# Patient Record
Sex: Male | Born: 1940 | Race: White | Hispanic: No | Marital: Married | Smoking: Current every day smoker
Health system: Southern US, Community
[De-identification: ages and names within clinical notes are randomized; demographics above are authoritative.]

## PROBLEM LIST (undated history)

## (undated) HISTORY — PX: HERNIA REPAIR: SHX51

---

## 2008-05-13 ENCOUNTER — Inpatient Hospital Stay (HOSPITAL_COMMUNITY): Admission: EM | Admit: 2008-05-13 | Discharge: 2008-05-14 | Payer: Self-pay | Admitting: Emergency Medicine

## 2009-11-28 IMAGING — CR DG CHEST 1V PORT
1 series · 1 of 1 positions shown · non-contrast
Comparison: None

CLINICAL DATA: Inguinal hernia.  Preop respiratory exam.

PORTABLE CHEST - 1 VIEW

[view not recorded]
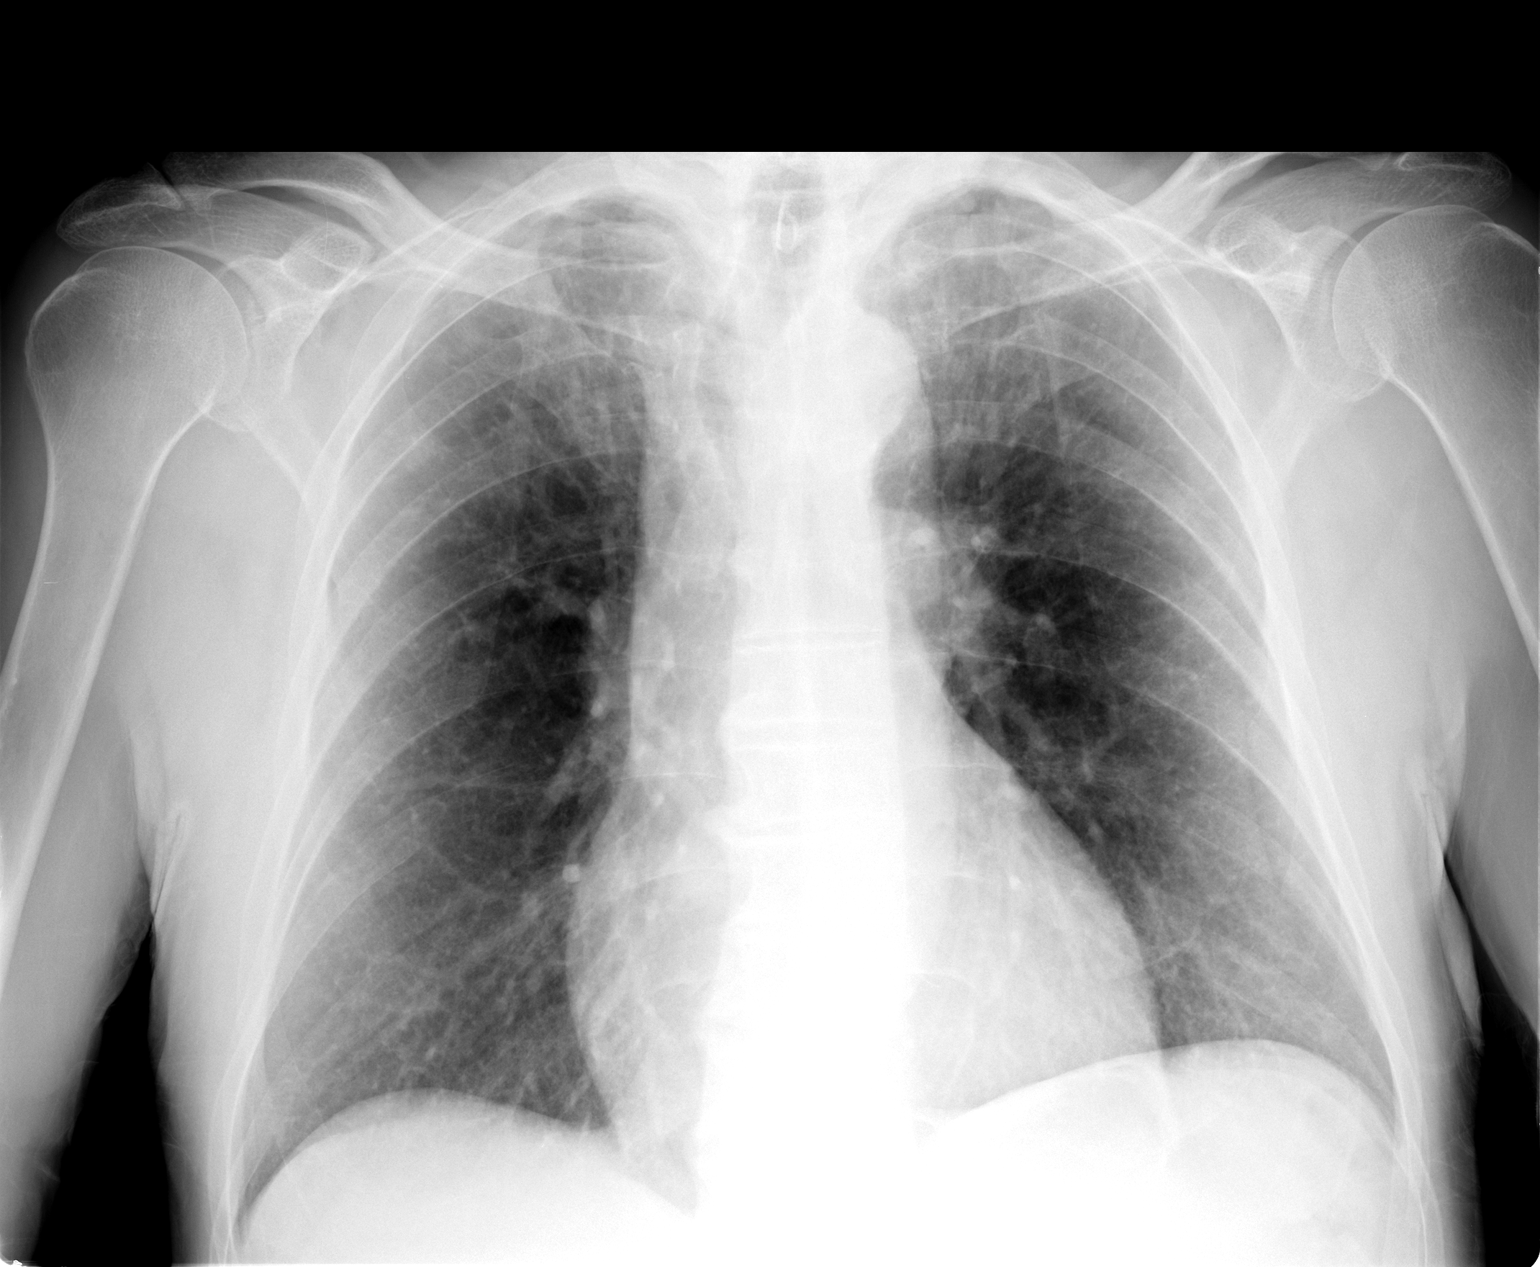

[1 of 1 positions shown; findings below may reference images not displayed]

FINDINGS: Heart size is at the upper limits of normal.  There is no
evidence of pulmonary infiltrate or edema.  There is no evidence of
pleural effusion.

A small nodular density is seen in the right upper lobe.
IMPRESSION: 1.  No acute infiltrate.
2.  Right upper lobe pulmonary nodular density.  Chest CT without
contrast is recommended for further evaluation.

## 2009-11-28 IMAGING — CT CT CHEST W/O CM
1 of 2 series · 14 of 30 positions shown, 18 images · non-contrast
Comparison: Chest radiograph earlier today.

CLINICAL DATA: Right upper lobe pulmonary nodule seen on chest
radiograph.  Evaluate for pulmonary mass.

CT CHEST WITHOUT CONTRAST
TECHNIQUE: Multidetector CT imaging of the chest was performed
following the standard protocol without IV contrast.

[Series 2: chestroutine 5.0 b40f · axial · 0.71mm/px · z∈[-402,-97]mm · 14 of 73 slices shown, 18 images]
[im 6/73  mediastinal]
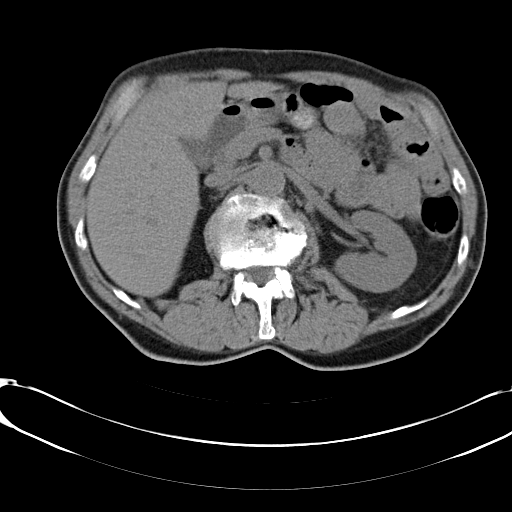
[im 6/73  lung]
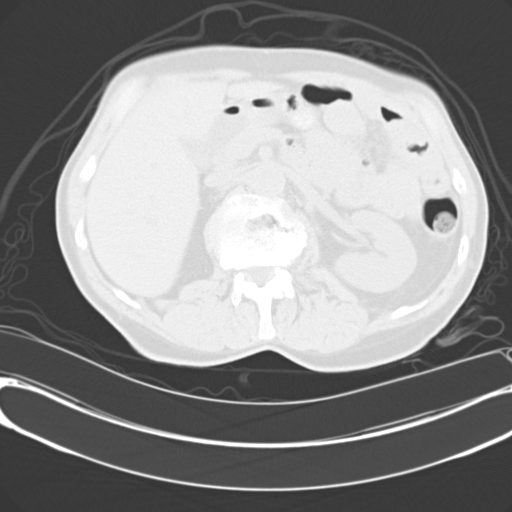
[im 11/73  lung]
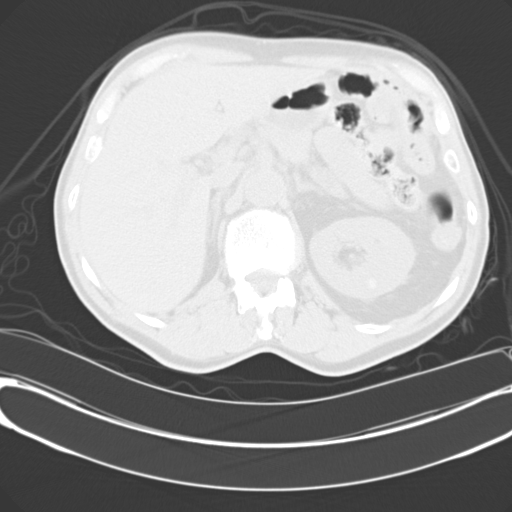
[im 16/73  lung]
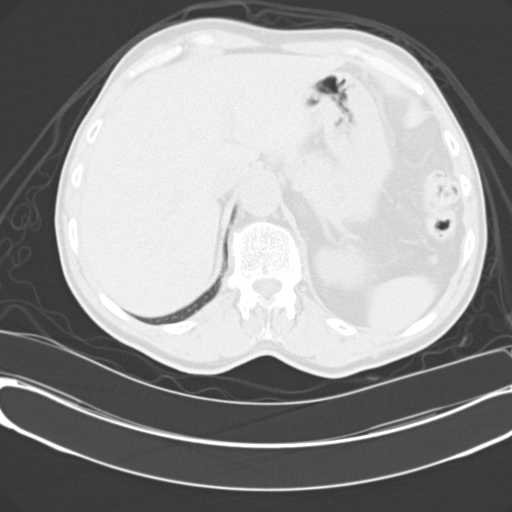
[im 21/73  lung]
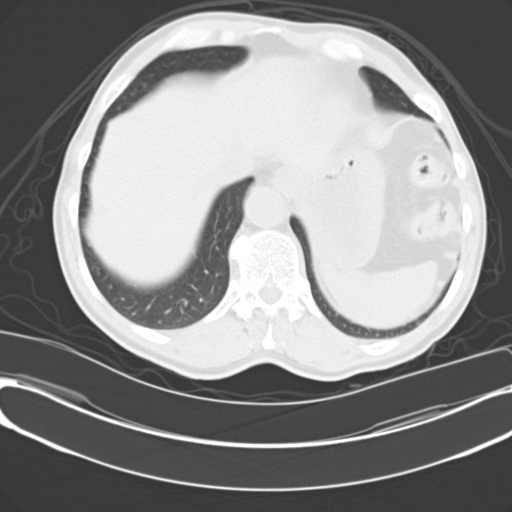
[im 26/73  mediastinal]
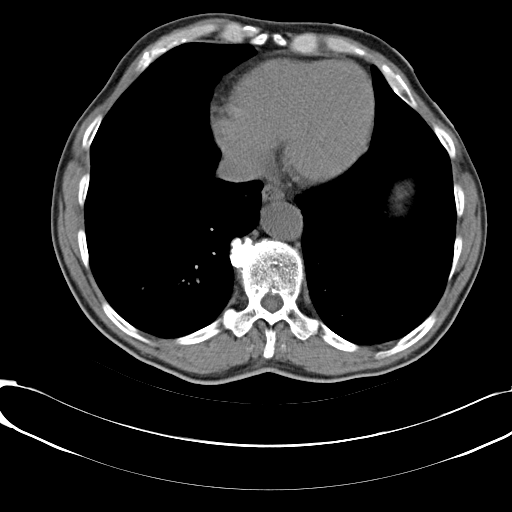
[im 26/73  lung]
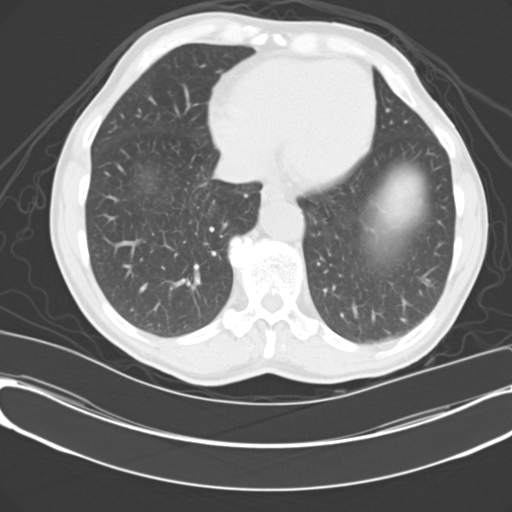
[im 31/73  lung]
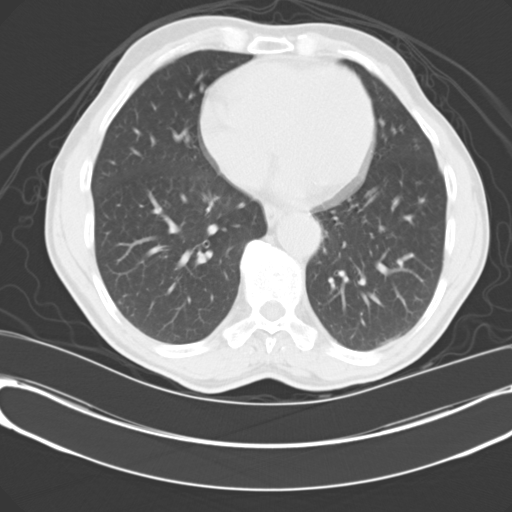
[im 35/73  lung]
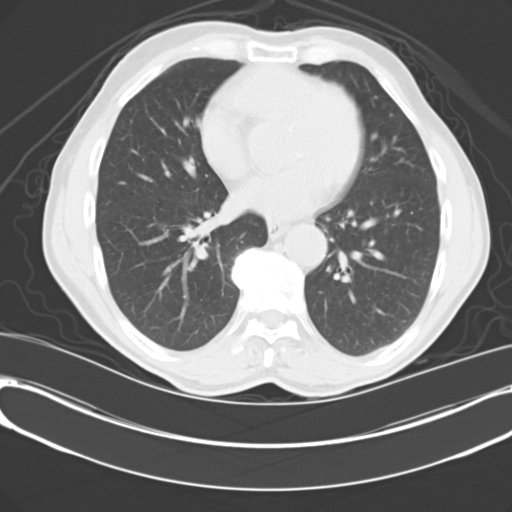
[im 37/73  lung]
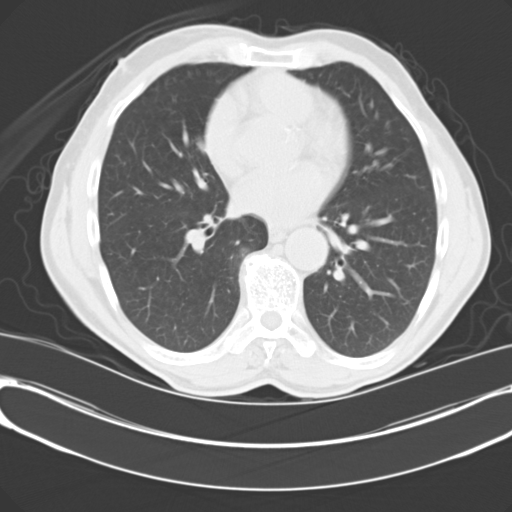
[im 42/73  mediastinal]
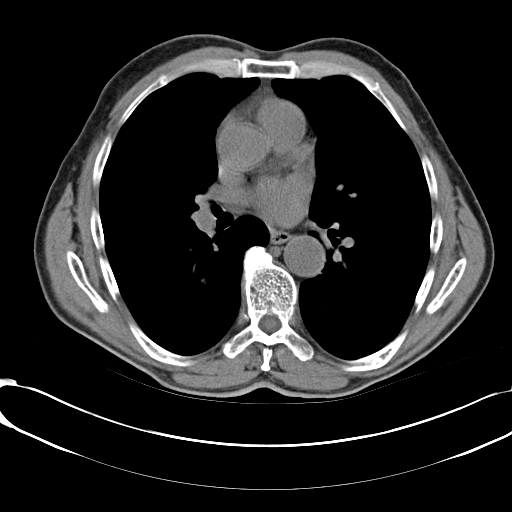
[im 42/73  lung]
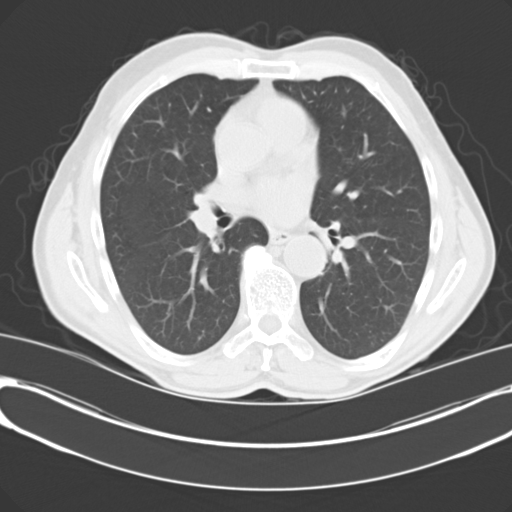
[im 47/73  lung]
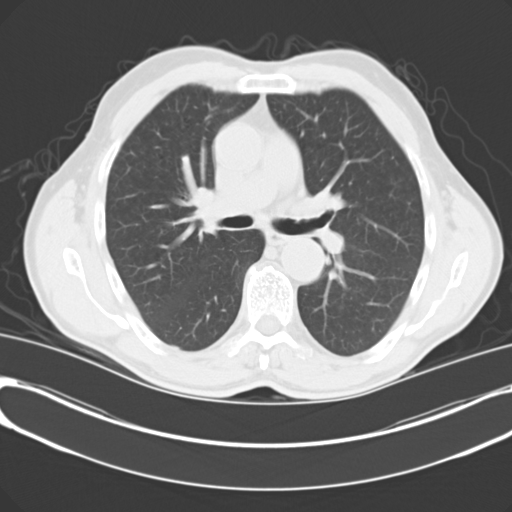
[im 52/73  lung]
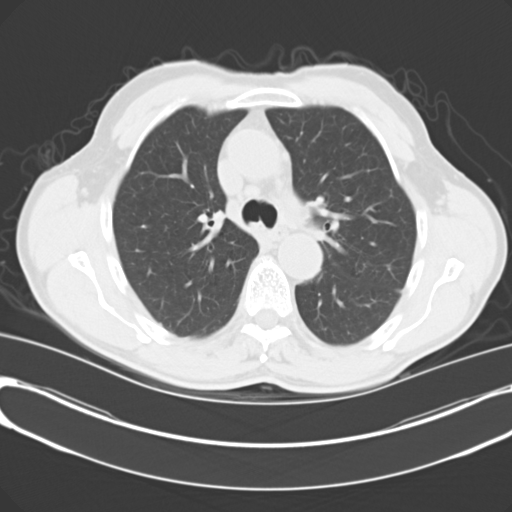
[im 57/73  lung]
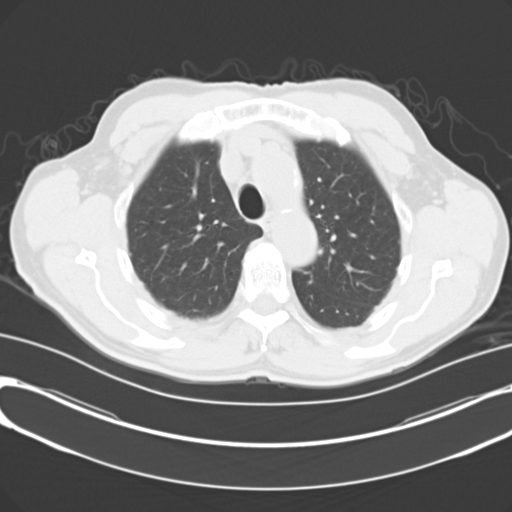
[im 62/73  mediastinal]
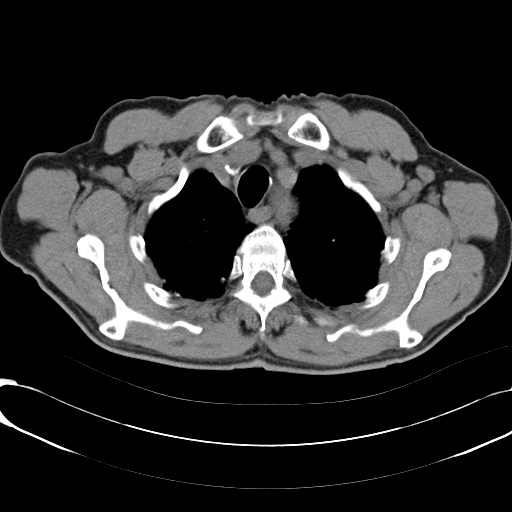
[im 62/73  lung]
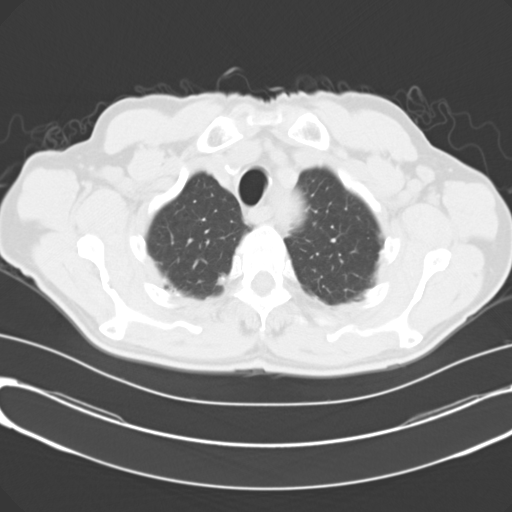
[im 67/73  lung]
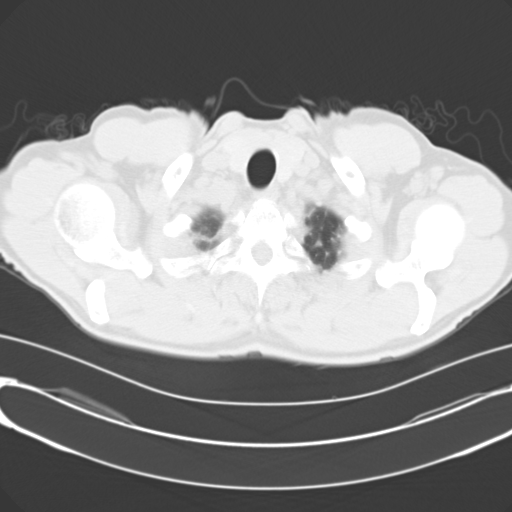

[14 of 30 positions shown; findings below may reference images not displayed]

FINDINGS: No suspicious pulmonary nodules or masses are identified.
Mild pleural - parenchymal scarring is seen in both upper lobes,
and this likely accounts for the nodular density seen on recent
chest radiograph.

There is no evidence of pulmonary infiltrate or pleural effusion.
There is no evidence of hilar or mediastinal masses, and no
adenopathy is seen elsewhere within the thorax.
IMPRESSION: No active disease.  No evidence of pulmonary nodule or mass.

## 2011-01-16 NOTE — H&P (Signed)
NAME:  Ryan [name], Ryan [name] NO.:  [id number]   MEDICAL RECORD NO.:  [id number]          PATIENT TYPE:  OBV   LOCATION:  A328                          FACILITY:  [facility]   PHYSICIAN:  Dalia Heading, M.D.  DATE OF BIRTH:  12-19-1940   DATE OF ADMISSION:  05/13/2008  DATE OF DISCHARGE:  LH                              HISTORY & PHYSICAL   CHIEF COMPLAINT:  Incarcerated right inguinal hernia.   HISTORY OF THE PRESENT ILLNESS:  The patient is a 70 year old white male  with a longstanding history of a right inguinal hernia, which was  usually reducible, who presents with a 13-hour history of the hernia not  being able to be reduced.  He has had some discomfort in this region,  but denies any nausea, vomiting or fevers.  At the bedside, I was able  to partially reduce the hernia, which relieved some of the pain he is  having.  The hernia is still present.   PAST MEDICAL HISTORY:  The past medical history is unremarkable.   PAST SURGICAL HISTORY:  The past surgical history is unremarkable.   CURRENT MEDICATIONS:  None.   ALLERGIES:  SHELLFISH.   SOCIAL HISTORY:  The patient does smoke tobacco.  He denies any  significant alcohol use.   REVIEW OF SYSTEMS:  The patient denies any recent chest pain, MI, CVA or  diabetes mellitus.   PHYSICAL EXAMINATION:  GENERAL APPEARANCE:  On physical examination the  patient is a well-developed, well-nourished white male in no acute  distress.  VITAL SIGNS:  The patient is afebrile.  Vital signs are stable.  LUNGS:  The lungs are clear to auscultation with equal breath sounds  bilaterally.  HEART:  The heart examination reveals regular rate and rhythm without  S3, S4 or murmurs.  ABDOMEN:  The abdomen is soft, nontender and nondistended.  No  hepatosplenomegaly or masses are noted.  As reported earlier a partially  reduced hernia on the right side is noted.  He also has an easily  reducible left inguinal hernia.  GENITALIA:  The  genitourinary examination is otherwise unremarkable.   LABORATORY DATA:  MET-7 is within normal limits.  White blood cell count  11.8, hematocrit 40.6 and platelet count 245,000.  Twelve-lead EKG  reveals no acute ischemic changes.  Normal sinus rhythm is noted.  Chest  x-ray reveals a questionable right lung nodule; further examination with  CT scan has been suggested.   IMPRESSION:  Incarcerated right inguinal hernia   PLAN:  The patient will be brought into the hospital to undergo a right  inguinal herniorrhaphy.  The risks and benefits of the procedure  including bleeding, infection, pain and the possibility of having to  repair the bowel, or recurrence of the hernia were fully explained to  the patient, and I gave him an informed consent.  A CT scan of the chest  will also be done to further examine the right upper lobe nodule.  Dr.  Shelva Majestic recall is aware of the patient's situation and will be  following the patient with me.  Dalia Heading, M.D.  Electronically Signed     MAJ/MEDQ  D:  05/13/2008  T:  05/13/2008  Job:  [id number]   cc:   Catalina Pizza, M.D.  Fax: [phone]

## 2011-01-16 NOTE — Discharge Summary (Signed)
NAME:  Ryan, [name] NO.:  [id number]   MEDICAL RECORD NO.:  [id number]          PATIENT TYPE:  INP   LOCATION:  A328                          FACILITY:  [facility]   PHYSICIAN:  Dalia Heading, M.D.  DATE OF BIRTH:  [birth year]   DATE OF ADMISSION:  05/13/2008  DATE OF DISCHARGE:  LH                               DISCHARGE SUMMARY   HOSPITAL COURSE SUMMARY:  The patient is a 70 year old white male  presented to the emergency room with an incarcerated right inguinal  hernia.  He was taken to the operating room and underwent a right  inguinal herniorrhaphy on May 13, 2008.  He tolerated the  procedure well.  His postoperative course was unremarkable.  His diet  was advanced without difficulty.  The patient is being discharged home  on postoperative day #1 in good improving condition.   DISCHARGE INSTRUCTIONS:  The patient is to follow up Dr. Franky Macho on  May 20, 2008.   DISCHARGE MEDICATIONS:  Vicodin 1-2 tablets p.o. q.4 h. p.r.n. pain.   PRINCIPAL DIAGNOSIS:  Incarcerated right inguinal hernia.   PRINCIPAL PROCEDURE:  Right inguinal herniorrhaphy on May 13, 2008.      Dalia Heading, M.D.  Electronically Signed     MAJ/MEDQ  D:  05/14/2008  T:  05/14/2008  Job:  [id number]   cc:   Catalina Pizza, M.D.  Fax: [phone]

## 2011-01-16 NOTE — Op Note (Signed)
NAME:  Ryan [name], Ryan [name] NO.:  [id number]   MEDICAL RECORD NO.:  [id number]          PATIENT TYPE:  INP   LOCATION:  A328                          FACILITY:  [facility]   PHYSICIAN:  Dalia Heading, M.D.  DATE OF BIRTH:  [birth year]   DATE OF PROCEDURE:  05/13/2008  DATE OF DISCHARGE:                               OPERATIVE REPORT   PREOPERATIVE DIAGNOSIS:  Incarcerated right inguinal hernia.   POSTOPERATIVE DIAGNOSIS:  Incarcerated right inguinal hernia.   PROCEDURE:  Right inguinal herniorrhaphy for incarceration.   SURGEON:  Dalia Heading, MD   ANESTHESIA:  Spinal.   INDICATIONS:  The patient is a 70 year old white male who presented to  the emergency room earlier this morning with an incarcerated right  inguinal hernia.  It was partially reduced.  The patient now comes to  the operating room for right inguinal herniorrhaphy.  The risks and  benefits of the procedure including bleeding, infection, pain, and  recurrence of the hernia were fully explained to the patient, gave  informed consent.   PROCEDURE NOTE:  The patient was placed in the supine position after  spinal anesthesia was administered.  The right groin region and scrotum  were prepped and draped using the usual sterile technique with  Hibiclens.  Surgical site confirmation was performed.   A transverse incision was made in the right groin region down to the  external oblique aponeuroses.  The aponeurosis was incised, even though  it was significantly thinned.  The patient was noted to have a large  indirect hernia sac extending to the testicle.  The contents were  reduced manually without difficulty.  The hernia sac was then freed away  from the spermatic cord and right testicle.  It was inverted at its  peritoneal reflection.  A large-size polypropylene mesh plug was then  placed into this region without difficulty.  An onlay polypropylene mesh  patch was then placed along the floor of the  inguinal canal and secured  superiorly to the conjoined tendon and inferiorly to the shelving edge  of Poupart ligament using 2-0 Novafil interrupted sutures.  The internal  ring was recreated using a 2-0 Novafil interrupted suture.  The external  oblique aponeurosis was reapproximated using a 2-0 Vicryl running  suture.  The subcutaneous layer was reapproximated using a 3-0 Vicryl  interrupted suture.  The skin was closed using staples.  Sensorcaine  0.5% was instilled in the surrounding wound.  Bacitracin ointment was  then applied.   All tape and needle counts were correct at end of the procedure.  The  patient was transferred to PACU in stable condition.   COMPLICATIONS:  None.   SPECIMEN:  None.   ESTIMATED BLOOD LOSS:  Minimal.      Dalia Heading, M.D.  Electronically Signed     MAJ/MEDQ  D:  05/13/2008  T:  05/14/2008  Job:  [id number]   cc:   Catalina Pizza, M.D.  Fax: [phone]

## 2011-06-06 LAB — BASIC METABOLIC PANEL
BUN: 12
CO2: 28
Calcium: 10.2
Chloride: 103
Creatinine, Ser: 0.87
GFR calc Af Amer: >60
GFR calc non Af Amer: >60
Glucose, Bld: 98
Potassium: 3.8
Sodium: 137

## 2011-06-06 LAB — CBC
HCT: 40.6
Hemoglobin: 14
MCHC: 34.5
MCV: 93.2
Platelets: 245
RBC: 4.35
RDW: 13.9
WBC: 11.8 — ABNORMAL HIGH

## 2012-06-18 DIAGNOSIS — Z23 Encounter for immunization: Secondary | ICD-10-CM | POA: Diagnosis not present

## 2013-04-17 ENCOUNTER — Ambulatory Visit: Payer: Self-pay | Admitting: Cardiology

## 2013-04-17 ENCOUNTER — Encounter: Payer: Self-pay | Admitting: Cardiology

## 2013-05-25 ENCOUNTER — Telehealth: Payer: Self-pay | Admitting: *Deleted

## 2013-05-26 NOTE — Telephone Encounter (Signed)
error 

## 2013-06-23 DIAGNOSIS — Z23 Encounter for immunization: Secondary | ICD-10-CM | POA: Diagnosis not present

## 2014-06-08 DIAGNOSIS — Z0000 Encounter for general adult medical examination without abnormal findings: Secondary | ICD-10-CM | POA: Diagnosis not present

## 2014-06-08 DIAGNOSIS — Z23 Encounter for immunization: Secondary | ICD-10-CM | POA: Diagnosis not present

## 2015-04-06 DIAGNOSIS — H3532 Exudative age-related macular degeneration: Secondary | ICD-10-CM | POA: Diagnosis not present

## 2015-04-22 ENCOUNTER — Encounter (INDEPENDENT_AMBULATORY_CARE_PROVIDER_SITE_OTHER): Payer: Medicare Other | Admitting: Ophthalmology

## 2015-04-22 DIAGNOSIS — H3531 Nonexudative age-related macular degeneration: Secondary | ICD-10-CM

## 2015-04-22 DIAGNOSIS — H43813 Vitreous degeneration, bilateral: Secondary | ICD-10-CM

## 2015-05-10 DIAGNOSIS — H3532 Exudative age-related macular degeneration: Secondary | ICD-10-CM | POA: Diagnosis not present

## 2015-06-16 DIAGNOSIS — Z23 Encounter for immunization: Secondary | ICD-10-CM | POA: Diagnosis not present

## 2015-08-22 ENCOUNTER — Ambulatory Visit (INDEPENDENT_AMBULATORY_CARE_PROVIDER_SITE_OTHER): Payer: Medicare Other | Admitting: Ophthalmology

## 2015-08-22 DIAGNOSIS — H2513 Age-related nuclear cataract, bilateral: Secondary | ICD-10-CM | POA: Diagnosis not present

## 2015-08-22 DIAGNOSIS — H353112 Nonexudative age-related macular degeneration, right eye, intermediate dry stage: Secondary | ICD-10-CM

## 2015-08-22 DIAGNOSIS — H43813 Vitreous degeneration, bilateral: Secondary | ICD-10-CM

## 2015-08-22 DIAGNOSIS — H353221 Exudative age-related macular degeneration, left eye, with active choroidal neovascularization: Secondary | ICD-10-CM

## 2016-02-20 ENCOUNTER — Ambulatory Visit (INDEPENDENT_AMBULATORY_CARE_PROVIDER_SITE_OTHER): Payer: Medicare Other | Admitting: Ophthalmology

## 2016-02-20 DIAGNOSIS — H353221 Exudative age-related macular degeneration, left eye, with active choroidal neovascularization: Secondary | ICD-10-CM

## 2016-02-20 DIAGNOSIS — H2513 Age-related nuclear cataract, bilateral: Secondary | ICD-10-CM

## 2016-02-20 DIAGNOSIS — H353112 Nonexudative age-related macular degeneration, right eye, intermediate dry stage: Secondary | ICD-10-CM

## 2016-02-20 DIAGNOSIS — H43813 Vitreous degeneration, bilateral: Secondary | ICD-10-CM | POA: Diagnosis not present

## 2016-05-01 ENCOUNTER — Other Ambulatory Visit: Payer: Self-pay

## 2016-06-12 DIAGNOSIS — E119 Type 2 diabetes mellitus without complications: Secondary | ICD-10-CM | POA: Diagnosis not present

## 2016-06-12 DIAGNOSIS — N529 Male erectile dysfunction, unspecified: Secondary | ICD-10-CM | POA: Diagnosis not present

## 2016-06-12 DIAGNOSIS — I10 Essential (primary) hypertension: Secondary | ICD-10-CM | POA: Diagnosis not present

## 2016-06-12 DIAGNOSIS — E785 Hyperlipidemia, unspecified: Secondary | ICD-10-CM | POA: Diagnosis not present

## 2016-06-12 DIAGNOSIS — I482 Chronic atrial fibrillation: Secondary | ICD-10-CM | POA: Diagnosis not present

## 2016-06-12 DIAGNOSIS — R7301 Impaired fasting glucose: Secondary | ICD-10-CM | POA: Diagnosis not present

## 2016-06-12 DIAGNOSIS — E782 Mixed hyperlipidemia: Secondary | ICD-10-CM | POA: Diagnosis not present

## 2016-06-12 DIAGNOSIS — E039 Hypothyroidism, unspecified: Secondary | ICD-10-CM | POA: Diagnosis not present

## 2016-06-14 DIAGNOSIS — Z720 Tobacco use: Secondary | ICD-10-CM | POA: Diagnosis not present

## 2016-06-14 DIAGNOSIS — K469 Unspecified abdominal hernia without obstruction or gangrene: Secondary | ICD-10-CM | POA: Diagnosis not present

## 2016-06-14 DIAGNOSIS — Z6822 Body mass index (BMI) 22.0-22.9, adult: Secondary | ICD-10-CM | POA: Diagnosis not present

## 2016-06-14 DIAGNOSIS — R030 Elevated blood-pressure reading, without diagnosis of hypertension: Secondary | ICD-10-CM | POA: Diagnosis not present

## 2016-06-14 DIAGNOSIS — H3530 Unspecified macular degeneration: Secondary | ICD-10-CM | POA: Diagnosis not present

## 2016-06-14 DIAGNOSIS — Z0000 Encounter for general adult medical examination without abnormal findings: Secondary | ICD-10-CM | POA: Diagnosis not present

## 2016-06-14 DIAGNOSIS — Z23 Encounter for immunization: Secondary | ICD-10-CM | POA: Diagnosis not present

## 2016-06-28 DIAGNOSIS — K4090 Unilateral inguinal hernia, without obstruction or gangrene, not specified as recurrent: Secondary | ICD-10-CM | POA: Diagnosis not present

## 2016-07-02 NOTE — H&P (Signed)
  NTS SOAP Note  Vital Signs:  Vitals as of: 06/28/2016: Systolic 159: Diastolic 88: Heart Rate 64: Temp 99.29F (Temporal): Height 336ft 0in: Weight 159Lbs 0 Ounces: BMI 21.56   BMI : 21.56 kg/m2  Subjective: This 75 year old male presents for of a left inguinal hernia.  Has been present for some time. May worsen with strainiing, stays down in scrotum.  No nausea, vomiting, constipation, diarrhea.  No significant pain noted.  Is uncomfortable.  Review of Symptoms:  Constitutional:negative Head:negative Eyes:negative Nose/Mouth/Throat:negative Cardiovascular:negative Respiratory:negative Gastrointestinnegative Genitourinary:negative Musculoskeletal:negative Skin:negative Hematolgic/Lymphatic:negative Allergic/Immunologic:negative   Past Medical History:Reviewed  Past Medical History  Surgical History: RIH Medical Problems: macular degeneration Allergies: shellfish Medications: areds, ester c   Social History:Reviewed  Social History  Preferred Language: English Race:  White Ethnicity: Not Hispanic / Latino Age: 75 year Marital Status:  M Alcohol: no   Smoking Status: Current every day smoker reviewed on 06/28/2016 Started Date:  Packs per week:  Functional Status reviewed on 06/28/2016 ------------------------------------------------ Bathing: Normal Cooking: Normal Dressing: Normal Driving: Normal Eating: Normal Managing Meds: Normal Oral Care: Normal Shopping: Normal Toileting: Normal Transferring: Normal Walking: Normal Cognitive Status reviewed on 06/28/2016 ------------------------------------------------ Attention: Normal Decision Making: Normal Language: Normal Memory: Normal Motor: Normal Perception: Normal Problem Solving: Normal Visual and Spatial: Normal   Family History:Reviewed  Family Health History Mother, Deceased; Healthy;  Father, Deceased; Healthy;     Objective Information: General:Well  appearing, well nourished in no distress. Skin:no rash or prominent lesions Neck:Supple without lymphadenopathy.  Heart:RRR, no murmur or gallop.  Normal S1, S2.  No S3, S4.  Lungs:CTA bilaterally, no wheezes, rhonchi, rales.  Breathing unlabored. Abdomen:Soft, NT/ND, normal bowel sounds, no HSM, no masses.  No peritoneal signs.  Large left inguinal hernia extending down into the scrotum. as above Dr. Scharlene GlossHall's notes reviewed. Assessment:Left inguinal hernia  Diagnoses: 550.90  K40.90 Unilateral or unspecified inguinal hernia, without mention of obstruction or gangrene (not specified as recurrent)  Procedures: [id number] - OFFICE OUTPATIENT NEW 30 MINUTES    Plan:  Scheduled for left inguinal herniorrhaphy with mesh on 07/16/16.   Patient Education:Alternative treatments to surgery were discussed with patient (and family).Risks and benefits  of procedure including bleeding, infection, mesh use, and the possibility of recurrence of the hernia were fully explained to the patient (and family) who gave informed consent. Patient/family questions were addressed.  Follow-up:Pending Surgery

## 2016-07-10 NOTE — Patient Instructions (Signed)
Ryan [name]  07/10/2016     @PREFPERIOPPHARMACY @   Your procedure is scheduled on  07/16/2016   Report to [facility] at  615  A.M.  Call this number if you have problems the morning of surgery:  [phone]   Remember:  Do not eat food or drink liquids after midnight.  Take these medicines the morning of surgery with A SIP OF WATER  none   Do not wear jewelry, make-up or nail polish.  Do not wear lotions, powders, or perfumes, or deoderant.  Do not shave 48 hours prior to surgery.  Men may shave face and neck.  Do not bring valuables to the hospital.  [facility] is not responsible for any belongings or valuables.  Contacts, dentures or bridgework may not be worn into surgery.  Leave your suitcase in the car.  After surgery it may be brought to your room.  For patients admitted to the hospital, discharge time will be determined by your treatment team.  Patients discharged the day of surgery will not be allowed to drive home.   Name and phone number of your driver:   family Special instructions:  none  Please read over the following fact sheets that you were given. Anesthesia Post-op Instructions and Care and Recovery After Surgery      Open Hernia Repair Open hernia repair is surgery to fix a hernia. A hernia occurs when an internal organ or tissue pushes out through a weak spot in the abdominal wall muscles. Hernias commonly occur in the groin and around the navel. Most hernias tend to get worse over time. Surgery is often done to prevent the hernia from getting bigger, becoming uncomfortable, or becoming an emergency. Emergency surgery may be needed if abdominal contents get stuck in the opening (incarcerated hernia) or the blood supply gets cut off (strangulated hernia). In an open repair, a large cut (incision) is made in the abdomen to perform the surgery. LET [facility] CARE PROVIDER KNOW ABOUT:  Any allergies you have.  All medicines  you are taking, including vitamins, herbs, eye drops, creams, and over-the-counter medicines.  Previous problems you or members of your family have had with the use of anesthetics.  Any blood disorders you have.  Previous surgeries you have had.  Medical conditions you have. RISKS AND COMPLICATIONS Generally, this is a safe procedure. However, as with any procedure, complications can occur. Possible complications include:  Infection.  Bleeding.  Nerve injury.  Chronic pain.  The hernia can come back.  Injury to the intestines. BEFORE THE PROCEDURE  Ask your health care provider about changing or stopping any regular medicines. Avoid taking aspirin or blood thinners as directed by your health care provider.  Do noteat or drink anything after midnight the night before surgery.  If you smoke, do not smoke for at least 2 weeks before your surgery.  Do not drink alcohol the day before your surgery.  Let your health care provider know if you develop a cold or any infection before your surgery.  Arrange for someone to drive you home after the procedure or after your hospital stay. Also arrange for someone to help you with activities during recovery. PROCEDURE   Small monitors will be put on your body. They are used to check your heart, blood pressure, and oxygen level.   An IV access tube will be put into one of your veins. Medicine  will be able to flow directly into your body through this IV tube.   You might be given a medicine to help you relax (sedative).   You will be given a medicine to make you sleep (general anesthetic). A breathing tube may be placed into your lungs during the procedure.  A cut (incision) is made over the hernia defect, and the contents are pushed back into the abdomen.  If the hernia is small, stitches may be used to bring the muscle edges back together.  Typically, a surgeon will place a mesh patch made of man-made material (synthetic) to  cover the defect. The mesh is sewn to healthy muscle. This reduces the risk of the hernia coming back.  The tissue and skin over the hernia are then closed with stitches or staples.  If the hernia was large, a drain may be left in place to collect excess fluid where the hernia used to be.  Bandages (dressings) are used to cover the incision. AFTER THE PROCEDURE  You will be taken to a recovery area where your progress will be monitored.  If the hernia was small or in the groin (inguinal) region, you will likely be allowed to go home once you are awake, stable, and taking fluids well.  If the hernia was large, you may have to wait for your bowel function to return. You may need to stay in the hospital for 2-3 days until you can eat and your pain is controlled. A drain may be left in place for 5-7 days. You will be taught how to care for the drain.   This information is not intended to replace advice given to you by your health care provider. Make sure you discuss any questions you have with your health care provider.   Document Released: 02/13/2001 Document Revised: 06/10/2013 Document Reviewed: 04/01/2013 Elsevier Interactive Patient Education 2016 Elsevier Inc[employer]  Open Hernia Repair, Care After These instructions give you information about caring for yourself after your procedure. Your doctor may also give you more specific instructions. Call your doctor if you have any problems or questions after your procedure. HOME CARE  Keep the cut (incision) area clean and dry. You may gently wash the incision area with soap and water 48 hours after surgery. To dry the incision area, gently blot or dab it.  Do not take baths, swim, or use a hot tub for 10 days or until your doctor approves.  Change bandages (dressings) as told by your doctor.  Check your incision area every day for signs of infection. Watch for:  Redness, swelling, or pain.  Fluid , blood, or pus.  Eat plenty of fruits and  vegetables. This helps to prevent constipation.  Drink enough fluid to keep your pee (urine) clear or pale yellow. This also helps to prevent constipation.  Do not drive or operate heavy machinery until your doctor says it is okay.  Do not lift anything that is heavier than 10 lb (4.5 kg) until your doctor approves.  Do not play contact sports for 4 weeks or until your doctor approves.  Take medicines only as told by your doctor.  Keep all follow-up visits as told by your doctor. This is important. Ask your doctor when to make an appointment to have your stitches (sutures) or staples removed. GET HELP IF:  The incision is bleeding more than before.  You have blood in your poop (stool).  The incision hurts more than before.  You have redness, swelling, or pain  in your incision area.  You have fluid, blood, or pus coming from your incision.  You have a fever.  You notice a bad smell coming from the incision area or the dressing. GET HELP RIGHT AWAY IF:  You have a rash.  Your chest hurts.  You are short of breath.  You feel light-headed.  You feel weak and dizzy (feel faint).   This information is not intended to replace advice given to you by your health care provider. Make sure you discuss any questions you have with your health care provider.   Document Released: 09/10/2014 Document Reviewed: 09/10/2014 Elsevier Interactive Patient Education 2016 Elsevier Inc. General Anesthesia, Adult General anesthesia is a sleep-like state of non-feeling produced by medicines (anesthetics). General anesthesia prevents you from being alert and feeling pain during a medical procedure. Your caregiver may recommend general anesthesia if your procedure:  Is long.  Is painful or uncomfortable.  Would be frightening to see or hear.  Requires you to be still.  Affects your breathing.  Causes significant blood loss. LET YOUR CAREGIVER KNOW ABOUT:  Allergies to food or  medicine.  Medicines taken, including vitamins, herbs, eyedrops, over-the-counter medicines, and creams.  Use of steroids (by mouth or creams).  Previous problems with anesthetics or numbing medicines, including problems experienced by relatives.  History of bleeding problems or blood clots.  Previous surgeries and types of anesthetics received.  Possibility of pregnancy, if this applies.  Use of cigarettes, alcohol, or illegal drugs.  Any health condition(s), especially diabetes, sleep apnea, and high blood pressure. RISKS AND COMPLICATIONS General anesthesia rarely causes complications. However, if complications do occur, they can be life threatening. Complications include:  A lung infection.  A stroke.  A heart attack.  Waking up during the procedure. When this occurs, the patient may be unable to move and communicate that he or she is awake. The patient may feel severe pain. Older adults and adults with serious medical problems are more likely to have complications than adults who are young and healthy. Some complications can be prevented by answering all of your caregiver's questions thoroughly and by following all pre-procedure instructions. It is important to tell your caregiver if any of the pre-procedure instructions, especially those related to diet, were not followed. Any food or liquid in the stomach can cause problems when you are under general anesthesia. BEFORE THE PROCEDURE  Ask your caregiver if you will have to spend the night at the hospital. If you will not have to spend the night, arrange to have an adult drive you and stay with you for 24 hours.  Follow your caregiver's instructions if you are taking dietary supplements or medicines. Your caregiver may tell you to stop taking them or to reduce your dosage.  Do not smoke for as long as possible before your procedure. If possible, stop smoking 3-6 weeks before the procedure.  Do not take new dietary supplements  or medicines within 1 week of your procedure unless your caregiver approves them.  Do not eat within 8 hours of your procedure or as directed by your caregiver. Drink only clear liquids, such as water, black coffee (without milk or cream), and fruit juices (without pulp).  Do not drink within 3 hours of your procedure or as directed by your caregiver.  You may brush your teeth on the morning of the procedure, but make sure to spit out the toothpaste and water when finished. PROCEDURE  You will receive anesthetics through a mask, through  an intravenous (IV) access tube, or through both. A doctor who specializes in anesthesia (anesthesiologist) or a nurse who specializes in anesthesia (nurse anesthetist) or both will stay with you throughout the procedure to make sure you remain unconscious. He or she will also watch your blood pressure, pulse, and oxygen levels to make sure that the anesthetics do not cause any problems. Once you are asleep, a breathing tube or mask may be used to help you breathe. AFTER THE PROCEDURE You will wake up after the procedure is complete. You may be in the room where the procedure was performed or in a recovery area. You may have a sore throat if a breathing tube was used. You may also feel:  Dizzy.  Weak.  Drowsy.  Confused.  Nauseous.  Cold. These are all normal responses and can be expected to last for up to 24 hours after the procedure is complete. A caregiver will tell you when you are ready to go home. This will usually be when you are fully awake and in stable condition.   This information is not intended to replace advice given to you by your health care provider. Make sure you discuss any questions you have with your health care provider.   Document Released: 11/27/2007 Document Revised: 09/10/2014 Document Reviewed: 12/19/2011 Elsevier Interactive Patient Education 2016 Elsevier Inc. General Anesthesia, Adult, Care After Refer to this sheet in the  next few weeks. These instructions provide you with information on caring for yourself after your procedure. Your health care provider may also give you more specific instructions. Your treatment has been planned according to current medical practices, but problems sometimes occur. Call your health care provider if you have any problems or questions after your procedure. WHAT TO EXPECT AFTER THE PROCEDURE After the procedure, it is typical to experience:  Sleepiness.  Nausea and vomiting. HOME CARE INSTRUCTIONS  For the first 24 hours after general anesthesia:  Have a responsible person with you.  Do not drive a car. If you are alone, do not take public transportation.  Do not drink alcohol.  Do not take medicine that has not been prescribed by your health care provider.  Do not sign important papers or make important decisions.  You may resume a normal diet and activities as directed by your health care provider.  Change bandages (dressings) as directed.  If you have questions or problems that seem related to general anesthesia, call the hospital and ask for the anesthetist or anesthesiologist on call. SEEK MEDICAL CARE IF:  You have nausea and vomiting that continue the day after anesthesia.  You develop a rash. SEEK IMMEDIATE MEDICAL CARE IF:   You have difficulty breathing.  You have chest pain.  You have any allergic problems.   This information is not intended to replace advice given to you by your health care provider. Make sure you discuss any questions you have with your health care provider.   Document Released: 11/26/2000 Document Revised: 09/10/2014 Document Reviewed: 12/19/2011 Elsevier Interactive Patient Education 2016 Elsevier Inc. PATIENT INSTRUCTIONS POST-ANESTHESIA  IMMEDIATELY FOLLOWING SURGERY:  Do not drive or operate machinery for the first twenty four hours after surgery.  Do not make any important decisions for twenty four hours after surgery or  while taking narcotic pain medications or sedatives.  If you develop intractable nausea and vomiting or a severe headache please notify your doctor immediately.  FOLLOW-UP:  Please make an appointment with your surgeon as instructed. You do not need to follow  up with anesthesia unless specifically instructed to do so.  WOUND CARE INSTRUCTIONS (if applicable):  Keep a dry clean dressing on the anesthesia/puncture wound site if there is drainage.  Once the wound has quit draining you may leave it open to air.  Generally you should leave the bandage intact for twenty four hours unless there is drainage.  If the epidural site drains for more than 36-48 hours please call the anesthesia department.  QUESTIONS?:  Please feel free to call your physician or the hospital operator if you have any questions, and they will be happy to assist you.

## 2016-07-12 ENCOUNTER — Encounter (HOSPITAL_COMMUNITY)
Admission: RE | Admit: 2016-07-12 | Discharge: 2016-07-12 | Disposition: A | Discharge status: Home / Self Care | Payer: Medicare Other | Source: Ambulatory Visit | Attending: General Surgery | Admitting: General Surgery

## 2016-07-12 ENCOUNTER — Other Ambulatory Visit: Payer: Self-pay

## 2016-07-12 DIAGNOSIS — R9431 Abnormal electrocardiogram [ECG] [EKG]: Secondary | ICD-10-CM | POA: Insufficient documentation

## 2016-07-12 DIAGNOSIS — Z01812 Encounter for preprocedural laboratory examination: Secondary | ICD-10-CM | POA: Diagnosis not present

## 2016-07-12 DIAGNOSIS — Z01818 Encounter for other preprocedural examination: Secondary | ICD-10-CM | POA: Diagnosis not present

## 2016-07-12 DIAGNOSIS — K4090 Unilateral inguinal hernia, without obstruction or gangrene, not specified as recurrent: Secondary | ICD-10-CM | POA: Insufficient documentation

## 2016-07-12 LAB — CBC WITH DIFFERENTIAL/PLATELET
BASOS ABS: 0 10*3/uL (ref 0.0–0.1)
BASOS PCT: 0 %
EOS ABS: 0.1 10*3/uL (ref 0.0–0.7)
Eosinophils Relative: 2 %
HCT: 39.2 % (ref 39.0–52.0)
HEMOGLOBIN: 12.9 g/dL — AB (ref 13.0–17.0)
Lymphocytes Relative: 23 %
Lymphs Abs: 1.4 10*3/uL (ref 0.7–4.0)
MCH: 30.9 pg (ref 26.0–34.0)
MCHC: 32.9 g/dL (ref 30.0–36.0)
MCV: 93.8 fL (ref 78.0–100.0)
Monocytes Absolute: 0.5 10*3/uL (ref 0.1–1.0)
Monocytes Relative: 7 %
NEUTROS PCT: 68 %
Neutro Abs: 4.2 10*3/uL (ref 1.7–7.7)
Platelets: 232 10*3/uL (ref 150–400)
RBC: 4.18 MIL/uL — AB (ref 4.22–5.81)
RDW: 14.5 % (ref 11.5–15.5)
WBC: 6.2 10*3/uL (ref 4.0–10.5)

## 2016-07-12 LAB — BASIC METABOLIC PANEL
ANION GAP: 4 — AB (ref 5–15)
BUN: 13 mg/dL (ref 6–20)
CALCIUM: 9 mg/dL (ref 8.9–10.3)
CHLORIDE: 105 mmol/L (ref 101–111)
CO2: 29 mmol/L (ref 22–32)
CREATININE: 0.84 mg/dL (ref 0.61–1.24)
GFR calc non Af Amer: >60 mL/min (ref >60)
Glucose, Bld: 89 mg/dL (ref 65–99)
Potassium: 3.9 mmol/L (ref 3.5–5.1)
SODIUM: 138 mmol/L (ref 135–145)

## 2016-07-16 ENCOUNTER — Encounter (HOSPITAL_COMMUNITY)
Admission: RE | Disposition: A | Discharge status: Home / Self Care | Payer: Self-pay | Source: Ambulatory Visit | Attending: General Surgery

## 2016-07-16 ENCOUNTER — Ambulatory Visit (HOSPITAL_COMMUNITY): Payer: Medicare Other | Admitting: Anesthesiology

## 2016-07-16 ENCOUNTER — Encounter (HOSPITAL_COMMUNITY): Payer: Self-pay | Admitting: *Deleted

## 2016-07-16 ENCOUNTER — Ambulatory Visit (HOSPITAL_COMMUNITY)
Admission: RE | Admit: 2016-07-16 | Discharge: 2016-07-16 | Disposition: A | Discharge status: Home / Self Care | Payer: Medicare Other | Source: Ambulatory Visit | Attending: General Surgery | Admitting: General Surgery

## 2016-07-16 DIAGNOSIS — K4090 Unilateral inguinal hernia, without obstruction or gangrene, not specified as recurrent: Secondary | ICD-10-CM | POA: Insufficient documentation

## 2016-07-16 DIAGNOSIS — F17200 Nicotine dependence, unspecified, uncomplicated: Secondary | ICD-10-CM | POA: Insufficient documentation

## 2016-07-16 HISTORY — PX: INGUINAL HERNIA REPAIR: SHX194

## 2016-07-16 SURGERY — REPAIR, HERNIA, INGUINAL, ADULT
Anesthesia: General | Laterality: Left

## 2016-07-16 MED ORDER — ONDANSETRON HCL 4 MG/2ML IJ SOLN
INTRAMUSCULAR | Status: AC
  Filled 2016-07-16: qty 2

## 2016-07-16 MED ORDER — KETOROLAC TROMETHAMINE 30 MG/ML IJ SOLN
30.0000 mg | Freq: Once | INTRAMUSCULAR | Status: AC
  Administered 2016-07-16: 30 mg via INTRAVENOUS

## 2016-07-16 MED ORDER — EPHEDRINE SULFATE 50 MG/ML IJ SOLN
INTRAMUSCULAR | Status: DC | PRN
  Administered 2016-07-16 (×3): 10 mg via INTRAVENOUS

## 2016-07-16 MED ORDER — FENTANYL CITRATE (PF) 100 MCG/2ML IJ SOLN
INTRAMUSCULAR | Status: DC | PRN
  Administered 2016-07-16 (×3): 25 ug via INTRAVENOUS

## 2016-07-16 MED ORDER — FENTANYL CITRATE (PF) 100 MCG/2ML IJ SOLN
25.0000 ug | INTRAMUSCULAR | Status: AC | PRN
  Administered 2016-07-16 (×2): 25 ug via INTRAVENOUS

## 2016-07-16 MED ORDER — PROPOFOL 10 MG/ML IV BOLUS
INTRAVENOUS | Status: DC | PRN
Start: 2016-07-16 — End: 2016-07-16
  Administered 2016-07-16: 150 mg via INTRAVENOUS

## 2016-07-16 MED ORDER — LIDOCAINE HCL (PF) 1 % IJ SOLN
INTRAMUSCULAR | Status: AC
  Filled 2016-07-16: qty 5

## 2016-07-16 MED ORDER — CHLORHEXIDINE GLUCONATE CLOTH 2 % EX PADS: 6.0000 | MEDICATED_PAD | Freq: Once | CUTANEOUS | Status: DC

## 2016-07-16 MED ORDER — HYDROCODONE-ACETAMINOPHEN 5-325 MG PO TABS: 1.0000 | ORAL_TABLET | Freq: Four times a day (QID) | ORAL | 0 refills | Status: DC | PRN

## 2016-07-16 MED ORDER — ONDANSETRON HCL 4 MG/2ML IJ SOLN
4.0000 mg | Freq: Once | INTRAMUSCULAR | Status: AC
  Administered 2016-07-16: 4 mg via INTRAVENOUS

## 2016-07-16 MED ORDER — PROPOFOL 10 MG/ML IV BOLUS
INTRAVENOUS | Status: AC
  Filled 2016-07-16: qty 20

## 2016-07-16 MED ORDER — MIDAZOLAM HCL 2 MG/2ML IJ SOLN
INTRAMUSCULAR | Status: AC
  Filled 2016-07-16: qty 2

## 2016-07-16 MED ORDER — LIDOCAINE HCL (CARDIAC) 10 MG/ML IV SOLN
INTRAVENOUS | Status: DC | PRN
  Administered 2016-07-16: 50 mg via INTRAVENOUS

## 2016-07-16 MED ORDER — MIDAZOLAM HCL 2 MG/2ML IJ SOLN
1.0000 mg | INTRAMUSCULAR | Status: DC | PRN
  Administered 2016-07-16: 2 mg via INTRAVENOUS

## 2016-07-16 MED ORDER — LACTATED RINGERS IV SOLN
INTRAVENOUS | Status: DC
  Administered 2016-07-16: 07:00:00 via INTRAVENOUS

## 2016-07-16 MED ORDER — SODIUM CHLORIDE 0.9 % IR SOLN
Status: DC | PRN
  Administered 2016-07-16: 1000 mL

## 2016-07-16 MED ORDER — FENTANYL CITRATE (PF) 100 MCG/2ML IJ SOLN: 25.0000 ug | INTRAMUSCULAR | Status: DC | PRN

## 2016-07-16 MED ORDER — BUPIVACAINE LIPOSOME 1.3 % IJ SUSP
INTRAMUSCULAR | Status: DC | PRN
  Administered 2016-07-16: 20 mL

## 2016-07-16 MED ORDER — KETOROLAC TROMETHAMINE 30 MG/ML IJ SOLN
INTRAMUSCULAR | Status: AC
  Filled 2016-07-16: qty 1

## 2016-07-16 MED ORDER — FENTANYL CITRATE (PF) 100 MCG/2ML IJ SOLN
INTRAMUSCULAR | Status: AC
  Filled 2016-07-16: qty 2

## 2016-07-16 MED ORDER — CEFAZOLIN SODIUM-DEXTROSE 2-4 GM/100ML-% IV SOLN
2.0000 g | INTRAVENOUS | Status: AC
  Administered 2016-07-16: 2 g via INTRAVENOUS
  Filled 2016-07-16: qty 100

## 2016-07-16 MED ORDER — FENTANYL CITRATE (PF) 250 MCG/5ML IJ SOLN
INTRAMUSCULAR | Status: AC
  Filled 2016-07-16: qty 5

## 2016-07-16 MED ORDER — SUCCINYLCHOLINE CHLORIDE 20 MG/ML IJ SOLN
INTRAMUSCULAR | Status: AC
  Filled 2016-07-16: qty 1

## 2016-07-16 MED ORDER — BUPIVACAINE LIPOSOME 1.3 % IJ SUSP
INTRAMUSCULAR | Status: AC
  Filled 2016-07-16: qty 20

## 2016-07-16 SURGICAL SUPPLY — 36 items
BAG HAMPER (MISCELLANEOUS) ×3 IMPLANT
CLOTH BEACON ORANGE TIMEOUT ST (SAFETY) ×3 IMPLANT
COVER LIGHT HANDLE STERIS (MISCELLANEOUS) ×6 IMPLANT
DERMABOND ADVANCED (GAUZE/BANDAGES/DRESSINGS) ×2
DERMABOND ADVANCED .7 DNX12 (GAUZE/BANDAGES/DRESSINGS) ×1 IMPLANT
DRAIN PENROSE 18X1/2 LTX STRL (DRAIN) ×3 IMPLANT
ELECT REM PT RETURN 9FT ADLT (ELECTROSURGICAL) ×3
ELECTRODE REM PT RTRN 9FT ADLT (ELECTROSURGICAL) ×1 IMPLANT
GLOVE BIOGEL PI IND STRL 7.0 (GLOVE) ×2 IMPLANT
GLOVE BIOGEL PI INDICATOR 7.0 (GLOVE) ×4
GLOVE SURG SS PI 7.5 STRL IVOR (GLOVE) ×3 IMPLANT
GOWN STRL REUS W/ TWL XL LVL3 (GOWN DISPOSABLE) ×1 IMPLANT
GOWN STRL REUS W/TWL LRG LVL3 (GOWN DISPOSABLE) ×9 IMPLANT
GOWN STRL REUS W/TWL XL LVL3 (GOWN DISPOSABLE) ×2
INST SET MINOR GENERAL (KITS) ×3 IMPLANT
KIT ROOM TURNOVER APOR (KITS) ×3 IMPLANT
MANIFOLD NEPTUNE II (INSTRUMENTS) ×3 IMPLANT
MESH HERNIA 1.6X1.9 PLUG LRG (Mesh General) ×1 IMPLANT
MESH HERNIA PLUG LRG (Mesh General) ×2 IMPLANT
NEEDLE HYPO 21X1.5 SAFETY (NEEDLE) ×3 IMPLANT
NS IRRIG 1000ML POUR BTL (IV SOLUTION) ×3 IMPLANT
PACK MINOR (CUSTOM PROCEDURE TRAY) ×3 IMPLANT
PAD ARMBOARD 7.5X6 YLW CONV (MISCELLANEOUS) ×3 IMPLANT
SCRUB PCMX 4 OZ (MISCELLANEOUS) ×3 IMPLANT
SET BASIN LINEN APH (SET/KITS/TRAYS/PACK) ×3 IMPLANT
SUT NOVA NAB GS-22 2 2-0 T-19 (SUTURE) ×9 IMPLANT
SUT PROLENE 2 0 SH 30 (SUTURE) IMPLANT
SUT SILK 3 0 (SUTURE)
SUT SILK 3-0 18XBRD TIE 12 (SUTURE) IMPLANT
SUT VIC AB 2-0 CT1 27 (SUTURE)
SUT VIC AB 2-0 CT1 TAPERPNT 27 (SUTURE) IMPLANT
SUT VIC AB 3-0 SH 27 (SUTURE) ×2
SUT VIC AB 3-0 SH 27X BRD (SUTURE) ×1 IMPLANT
SUT VIC AB 4-0 PS2 27 (SUTURE) ×3 IMPLANT
SUT VICRYL AB 3 0 TIES (SUTURE) ×3 IMPLANT
SYR 20CC LL (SYRINGE) ×3 IMPLANT

## 2016-07-16 NOTE — Interval H&P Note (Signed)
History and Physical Interval Note:  07/16/2016 7:17 AM  Ryan PeacockJohn W Dimaggio  has presented today for surgery, with the diagnosis of left inguinal hernia  The various methods of treatment have been discussed with the patient and family. After consideration of risks, benefits and other options for treatment, the patient has consented to  Procedure(s): HERNIA REPAIR INGUINAL ADULT WITH MESH (Left) as a surgical intervention .  The patient's history has been reviewed, patient examined, no change in status, stable for surgery.  I have reviewed the patient's chart and labs.  Questions were answered to the patient's satisfaction.     [name],[name]

## 2016-07-16 NOTE — Transfer of Care (Signed)
Immediate Anesthesia Transfer of Care Note  Patient: Ryan PeacockJohn W [name]  Procedure(s) Performed: Procedure(s): HERNIA REPAIR INGUINAL ADULT WITH MESH (Left)  Patient Location: PACU  Anesthesia Type:General  Level of Consciousness: sedated  Airway & Oxygen Therapy: Patient Spontanous Breathing and Patient connected to face mask oxygen  Post-op Assessment: Report given to RN, Post -op Vital signs reviewed and stable and Patient moving all extremities X 4  Post vital signs: Reviewed and stable  Last Vitals:  Vitals:   07/16/16 0628  BP: (!) 157/99  Pulse: 63  Resp: 16  Temp: 36.7 C    Last Pain:  Vitals:   07/16/16 0628  TempSrc: Oral      Patients Stated Pain Goal: 5 (07/16/16 [id number])  Complications: No apparent anesthesia complications

## 2016-07-16 NOTE — Anesthesia Postprocedure Evaluation (Signed)
Anesthesia Post Note  Patient: Ryan PeacockJohn W Tomlin  Procedure(s) Performed: Procedure(s) (LRB): HERNIA REPAIR INGUINAL ADULT WITH MESH (Left)  Patient location during evaluation: PACU Anesthesia Type: General Level of consciousness: awake, awake and alert and responds to stimulation Pain management: pain level controlled Vital Signs Assessment: post-procedure vital signs reviewed and stable Respiratory status: spontaneous breathing and non-rebreather facemask Cardiovascular status: stable Anesthetic complications: no    Last Vitals:  Vitals:   07/16/16 0628  BP: (!) 157/99  Pulse: 63  Resp: 16  Temp: 36.7 C    Last Pain:  Vitals:   07/16/16 0628  TempSrc: Oral                 Providence Stivers

## 2016-07-16 NOTE — Addendum Note (Signed)
Addendum  created 07/16/16 [id number] by Shary DecampVedwattie Sharlynn Seckinger, CRNA   Anesthesia Intra Blocks edited, Sign clinical note

## 2016-07-16 NOTE — Discharge Instructions (Signed)
Open Hernia Repair, Care After °Refer to this sheet in the next few weeks. These instructions provide you with information on caring for yourself after your procedure. Your health care provider may also give you more specific instructions. Your treatment has been planned according to current medical practices, but problems sometimes occur. Call your health care provider if you have any problems or questions after your procedure. °WHAT TO EXPECT AFTER THE PROCEDURE °After your procedure, it is typical to have the following: °· Pain in your abdomen, especially along your incision. You will be given pain medicines to control the pain. °· Constipation. You may be given a stool softener to help prevent this. °HOME CARE INSTRUCTIONS °· Only take over-the-counter or prescription medicines as directed by your health care provider. °· Keep the incision area dry and clean. You may wash the incision area gently with soap and water 48 hours after surgery. Gently blot or dab the incision area dry. Do not take baths, use swimming pools, or use hot tubs for 10 days or until your health care provider approves. °· Change bandages (dressings) as directed by your health care provider. °· Continue your normal diet as directed by your health care provider. Eat plenty of fruits and vegetables to help prevent constipation. °· Drink enough fluids to keep your urine clear or pale yellow. This also helps prevent constipation. °· Do not drive until your health care provider says it is okay. °· Do not lift anything heavier than 10 lb (4.5 kg) or play contact sports for 4 weeks or until your health care provider approves. °· Follow up with your health care provider as directed. Ask your health care provider when to make an appointment to have your stitches (sutures) or staples removed. °SEEK MEDICAL CARE IF: °· You have increased bleeding coming from the incision site. °· You have blood in your stool. °· You have increasing pain in the incision  area. °· You see redness or swelling in the incision area. °· You have fluid (pus) coming from the incision. °· You have a fever. °· You notice a bad smell coming from the incision area or dressing. °SEEK IMMEDIATE MEDICAL CARE IF: °· You develop a rash. °· You have chest pain or shortness of breath. °· You feel lightheaded or feel faint. °  °This information is not intended to replace advice given to you by your health care provider. Make sure you discuss any questions you have with your health care provider. °  °Document Released: 03/09/2005 Document Revised: 09/10/2014 Document Reviewed: 04/01/2013 °Elsevier Interactive Patient Education ©2016 Elsevier Inc. ° °

## 2016-07-16 NOTE — Anesthesia Preprocedure Evaluation (Signed)
Anesthesia Evaluation  Patient identified by MRN, date of birth, ID band Patient awake    Reviewed: Allergy & Precautions, NPO status , Patient's Chart, lab work & pertinent test results  Airway Mallampati: I  TM Distance: >3 FB     Dental  (+) Poor Dentition, Missing, Chipped, Dental Advisory Given   Pulmonary Current Smoker,    breath sounds clear to auscultation       Cardiovascular negative cardio ROS   Rhythm:Regular Rate:Normal     Neuro/Psych negative neurological ROS     GI/Hepatic negative GI ROS,   Endo/Other    Renal/GU      Musculoskeletal   Abdominal   Peds  Hematology   Anesthesia Other Findings   Reproductive/Obstetrics                             Anesthesia Physical Anesthesia Plan  ASA: II  Anesthesia Plan: General   Post-op Pain Management:    Induction: Intravenous  Airway Management Planned: LMA  Additional Equipment:   Intra-op Plan:   Post-operative Plan: Extubation in OR  Informed Consent: I have reviewed the patients History and Physical, chart, labs and discussed the procedure including the risks, benefits and alternatives for the proposed anesthesia with the patient or authorized representative who has indicated his/her understanding and acceptance.     Plan Discussed with:   Anesthesia Plan Comments:         Anesthesia Quick Evaluation

## 2016-07-16 NOTE — Anesthesia Procedure Notes (Addendum)
Procedure Name: Intubation Date/Time: 07/16/2016 7:37 AM Performed by: Patrcia DollyMOSES, Teofilo Lupinacci Pre-anesthesia Checklist: Patient identified, Patient being monitored, Timeout performed, Emergency Drugs available and Suction available Patient Re-evaluated:Patient Re-evaluated prior to inductionOxygen Delivery Method: Circle system utilized Preoxygenation: Pre-oxygenation with 100% oxygen Intubation Type: IV induction Ventilation: Mask ventilation without difficulty LMA: LMA flexible inserted LMA Size: 4.0 Number of attempts: 1 Placement Confirmation: positive ETCO2 and breath sounds checked- equal and bilateral Tube secured with: Tape Dental Injury: Teeth and Oropharynx as per pre-operative assessment

## 2016-07-16 NOTE — Op Note (Signed)
Patient:  Ryan PeacockJohn W [name]  DOB:  05/09/1941  MRN:  [id number]   Preop Diagnosis:  Left inguinal hernia  Postop Diagnosis:  Same  Procedure:  Left inguinal herniorrhaphy with mesh  Surgeon:  Franky MachoMark Fatou Dunnigan, M.D.  Anes:  Gen.  Indications:  Patient is a 75 year old white male who presents with a symptomatic left inguinal hernia. The risks and benefits of the procedure including bleeding, infection, mesh use, and the possibility of recurrence of the hernia were fully explained to the patient, who gave informed consent.  Procedure note:  The patient was placed in the supine position. After general anesthesia was administered, the left groin region was prepped and draped using the usual sterile technique with [employer]. Surgical site confirmation was performed.  An incision was made in the left groin region down to the external oblique aponeuroses. The aponeuroses was incised to the external ring. A Penrose drain was placed around the spermatic cord. The vase deferens was noted within the spermatic cord. The ilioinguinal nerve was identified retracted superior from the operative field. The patient had a large direct hernia sac. This was freed away from the spermatic cord up to the peritoneal reflection and inverted. A large Bard PerFix plug was then inserted and secured circumferentially to the transversalis fascia using 2-0 Novafil interrupted sutures. An onlay patch was then placed along the floor of the inguinal canal and secured superiorly to the conjoined tendon and inferiorly to the shelving edge of Poupart's ligament using 2-0 Novafil interrupted sutures. The internal ring was recreated using a 2-0 Novafil interrupted suture. The external oblique aponeuroses was reapproximated using a 2-0 Vicryl running suture. Subcutaneous layer was reapproximated using a 3-0 Vicryl interrupted suture.  Exparel was instilled into the surrounding wound. The skin was closed using a 4-0 Vicryl subcuticular suture.  Dermabond was then applied.  All tape and needle counts were correct at the end of the procedure. The patient was awakened and transferred to PACU in stable condition.  Complications:  None  EBL:  Minimal  Specimen:  None

## 2016-07-17 ENCOUNTER — Encounter (HOSPITAL_COMMUNITY): Payer: Self-pay | Admitting: General Surgery

## 2016-08-21 ENCOUNTER — Ambulatory Visit (INDEPENDENT_AMBULATORY_CARE_PROVIDER_SITE_OTHER): Payer: Medicare Other | Admitting: Ophthalmology

## 2016-08-21 DIAGNOSIS — H353112 Nonexudative age-related macular degeneration, right eye, intermediate dry stage: Secondary | ICD-10-CM

## 2016-08-21 DIAGNOSIS — H43813 Vitreous degeneration, bilateral: Secondary | ICD-10-CM

## 2016-08-21 DIAGNOSIS — H353221 Exudative age-related macular degeneration, left eye, with active choroidal neovascularization: Secondary | ICD-10-CM

## 2017-02-27 ENCOUNTER — Ambulatory Visit (INDEPENDENT_AMBULATORY_CARE_PROVIDER_SITE_OTHER): Payer: Medicare Other | Admitting: Ophthalmology

## 2017-03-25 ENCOUNTER — Ambulatory Visit (INDEPENDENT_AMBULATORY_CARE_PROVIDER_SITE_OTHER): Payer: Medicare Other | Admitting: Ophthalmology

## 2017-03-25 DIAGNOSIS — H43813 Vitreous degeneration, bilateral: Secondary | ICD-10-CM | POA: Diagnosis not present

## 2017-03-25 DIAGNOSIS — H353221 Exudative age-related macular degeneration, left eye, with active choroidal neovascularization: Secondary | ICD-10-CM

## 2017-03-25 DIAGNOSIS — H353112 Nonexudative age-related macular degeneration, right eye, intermediate dry stage: Secondary | ICD-10-CM | POA: Diagnosis not present

## 2017-06-11 DIAGNOSIS — Z23 Encounter for immunization: Secondary | ICD-10-CM | POA: Diagnosis not present

## 2017-09-09 ENCOUNTER — Encounter (INDEPENDENT_AMBULATORY_CARE_PROVIDER_SITE_OTHER): Payer: Medicare Other | Admitting: Ophthalmology

## 2017-09-09 DIAGNOSIS — H353231 Exudative age-related macular degeneration, bilateral, with active choroidal neovascularization: Secondary | ICD-10-CM

## 2017-09-09 DIAGNOSIS — H43813 Vitreous degeneration, bilateral: Secondary | ICD-10-CM | POA: Diagnosis not present

## 2017-09-09 DIAGNOSIS — H2513 Age-related nuclear cataract, bilateral: Secondary | ICD-10-CM | POA: Diagnosis not present

## 2017-10-08 ENCOUNTER — Encounter (INDEPENDENT_AMBULATORY_CARE_PROVIDER_SITE_OTHER): Payer: Medicare Other | Admitting: Ophthalmology

## 2017-10-08 DIAGNOSIS — H2513 Age-related nuclear cataract, bilateral: Secondary | ICD-10-CM

## 2017-10-08 DIAGNOSIS — H43813 Vitreous degeneration, bilateral: Secondary | ICD-10-CM

## 2017-10-08 DIAGNOSIS — H353231 Exudative age-related macular degeneration, bilateral, with active choroidal neovascularization: Secondary | ICD-10-CM | POA: Diagnosis not present

## 2017-10-15 DIAGNOSIS — I10 Essential (primary) hypertension: Secondary | ICD-10-CM | POA: Diagnosis not present

## 2017-10-17 DIAGNOSIS — Z6821 Body mass index (BMI) 21.0-21.9, adult: Secondary | ICD-10-CM | POA: Diagnosis not present

## 2017-10-17 DIAGNOSIS — H3530 Unspecified macular degeneration: Secondary | ICD-10-CM | POA: Diagnosis not present

## 2017-10-17 DIAGNOSIS — D509 Iron deficiency anemia, unspecified: Secondary | ICD-10-CM | POA: Diagnosis not present

## 2017-10-17 DIAGNOSIS — Z720 Tobacco use: Secondary | ICD-10-CM | POA: Diagnosis not present

## 2017-11-05 ENCOUNTER — Encounter (INDEPENDENT_AMBULATORY_CARE_PROVIDER_SITE_OTHER): Payer: Medicare Other | Admitting: Ophthalmology

## 2017-11-05 DIAGNOSIS — H43813 Vitreous degeneration, bilateral: Secondary | ICD-10-CM | POA: Diagnosis not present

## 2017-11-05 DIAGNOSIS — H353231 Exudative age-related macular degeneration, bilateral, with active choroidal neovascularization: Secondary | ICD-10-CM | POA: Diagnosis not present

## 2017-11-05 DIAGNOSIS — H2513 Age-related nuclear cataract, bilateral: Secondary | ICD-10-CM

## 2017-12-03 ENCOUNTER — Encounter (INDEPENDENT_AMBULATORY_CARE_PROVIDER_SITE_OTHER): Payer: Medicare Other | Admitting: Ophthalmology

## 2017-12-03 DIAGNOSIS — H353231 Exudative age-related macular degeneration, bilateral, with active choroidal neovascularization: Secondary | ICD-10-CM

## 2017-12-03 DIAGNOSIS — H43813 Vitreous degeneration, bilateral: Secondary | ICD-10-CM | POA: Diagnosis not present

## 2017-12-03 DIAGNOSIS — H2513 Age-related nuclear cataract, bilateral: Secondary | ICD-10-CM

## 2017-12-31 ENCOUNTER — Encounter (INDEPENDENT_AMBULATORY_CARE_PROVIDER_SITE_OTHER): Payer: Medicare Other | Admitting: Ophthalmology

## 2017-12-31 DIAGNOSIS — H2513 Age-related nuclear cataract, bilateral: Secondary | ICD-10-CM | POA: Diagnosis not present

## 2017-12-31 DIAGNOSIS — H43813 Vitreous degeneration, bilateral: Secondary | ICD-10-CM

## 2017-12-31 DIAGNOSIS — H353213 Exudative age-related macular degeneration, right eye, with inactive scar: Secondary | ICD-10-CM | POA: Diagnosis not present

## 2018-02-07 ENCOUNTER — Encounter (INDEPENDENT_AMBULATORY_CARE_PROVIDER_SITE_OTHER): Payer: Medicare Other | Admitting: Ophthalmology

## 2018-02-07 DIAGNOSIS — H353231 Exudative age-related macular degeneration, bilateral, with active choroidal neovascularization: Secondary | ICD-10-CM

## 2018-02-07 DIAGNOSIS — H43813 Vitreous degeneration, bilateral: Secondary | ICD-10-CM | POA: Diagnosis not present

## 2018-02-07 DIAGNOSIS — H2513 Age-related nuclear cataract, bilateral: Secondary | ICD-10-CM | POA: Diagnosis not present

## 2018-03-14 ENCOUNTER — Encounter (INDEPENDENT_AMBULATORY_CARE_PROVIDER_SITE_OTHER): Payer: Medicare Other | Admitting: Ophthalmology

## 2018-03-14 DIAGNOSIS — H43813 Vitreous degeneration, bilateral: Secondary | ICD-10-CM

## 2018-03-14 DIAGNOSIS — H353231 Exudative age-related macular degeneration, bilateral, with active choroidal neovascularization: Secondary | ICD-10-CM

## 2018-04-07 ENCOUNTER — Encounter (INDEPENDENT_AMBULATORY_CARE_PROVIDER_SITE_OTHER): Payer: Medicare Other | Admitting: Ophthalmology

## 2018-04-18 ENCOUNTER — Encounter (INDEPENDENT_AMBULATORY_CARE_PROVIDER_SITE_OTHER): Payer: Medicare Other | Admitting: Ophthalmology

## 2018-04-18 DIAGNOSIS — H43813 Vitreous degeneration, bilateral: Secondary | ICD-10-CM

## 2018-04-18 DIAGNOSIS — H353231 Exudative age-related macular degeneration, bilateral, with active choroidal neovascularization: Secondary | ICD-10-CM

## 2018-04-18 DIAGNOSIS — H2513 Age-related nuclear cataract, bilateral: Secondary | ICD-10-CM

## 2018-05-19 DIAGNOSIS — Z6821 Body mass index (BMI) 21.0-21.9, adult: Secondary | ICD-10-CM | POA: Diagnosis not present

## 2018-05-19 DIAGNOSIS — R030 Elevated blood-pressure reading, without diagnosis of hypertension: Secondary | ICD-10-CM | POA: Diagnosis not present

## 2018-05-19 DIAGNOSIS — H3530 Unspecified macular degeneration: Secondary | ICD-10-CM | POA: Diagnosis not present

## 2018-05-19 DIAGNOSIS — D509 Iron deficiency anemia, unspecified: Secondary | ICD-10-CM | POA: Diagnosis not present

## 2018-05-19 DIAGNOSIS — Z720 Tobacco use: Secondary | ICD-10-CM | POA: Diagnosis not present

## 2018-05-21 DIAGNOSIS — D649 Anemia, unspecified: Secondary | ICD-10-CM | POA: Diagnosis not present

## 2018-05-21 DIAGNOSIS — Z720 Tobacco use: Secondary | ICD-10-CM | POA: Diagnosis not present

## 2018-05-21 DIAGNOSIS — M19041 Primary osteoarthritis, right hand: Secondary | ICD-10-CM | POA: Diagnosis not present

## 2018-05-21 DIAGNOSIS — Z9889 Other specified postprocedural states: Secondary | ICD-10-CM | POA: Diagnosis not present

## 2018-05-21 DIAGNOSIS — R011 Cardiac murmur, unspecified: Secondary | ICD-10-CM | POA: Diagnosis not present

## 2018-05-21 DIAGNOSIS — H3530 Unspecified macular degeneration: Secondary | ICD-10-CM | POA: Diagnosis not present

## 2018-05-21 DIAGNOSIS — Z23 Encounter for immunization: Secondary | ICD-10-CM | POA: Diagnosis not present

## 2018-05-21 DIAGNOSIS — Z0000 Encounter for general adult medical examination without abnormal findings: Secondary | ICD-10-CM | POA: Diagnosis not present

## 2018-05-28 ENCOUNTER — Encounter (INDEPENDENT_AMBULATORY_CARE_PROVIDER_SITE_OTHER): Payer: Medicare Other | Admitting: Ophthalmology

## 2018-05-28 DIAGNOSIS — H43813 Vitreous degeneration, bilateral: Secondary | ICD-10-CM

## 2018-05-28 DIAGNOSIS — H353231 Exudative age-related macular degeneration, bilateral, with active choroidal neovascularization: Secondary | ICD-10-CM | POA: Diagnosis not present

## 2018-05-28 DIAGNOSIS — H2513 Age-related nuclear cataract, bilateral: Secondary | ICD-10-CM | POA: Diagnosis not present

## 2018-07-04 ENCOUNTER — Encounter (INDEPENDENT_AMBULATORY_CARE_PROVIDER_SITE_OTHER): Payer: Medicare Other | Admitting: Ophthalmology

## 2018-07-04 DIAGNOSIS — H2513 Age-related nuclear cataract, bilateral: Secondary | ICD-10-CM

## 2018-07-04 DIAGNOSIS — H353231 Exudative age-related macular degeneration, bilateral, with active choroidal neovascularization: Secondary | ICD-10-CM | POA: Diagnosis not present

## 2018-07-04 DIAGNOSIS — H43813 Vitreous degeneration, bilateral: Secondary | ICD-10-CM

## 2018-07-08 DIAGNOSIS — Z1211 Encounter for screening for malignant neoplasm of colon: Secondary | ICD-10-CM | POA: Diagnosis not present

## 2018-07-08 DIAGNOSIS — Z1212 Encounter for screening for malignant neoplasm of rectum: Secondary | ICD-10-CM | POA: Diagnosis not present

## 2018-08-08 ENCOUNTER — Encounter (INDEPENDENT_AMBULATORY_CARE_PROVIDER_SITE_OTHER): Payer: Medicare Other | Admitting: Ophthalmology

## 2018-08-08 DIAGNOSIS — H2513 Age-related nuclear cataract, bilateral: Secondary | ICD-10-CM

## 2018-08-08 DIAGNOSIS — H353231 Exudative age-related macular degeneration, bilateral, with active choroidal neovascularization: Secondary | ICD-10-CM

## 2018-08-08 DIAGNOSIS — H43813 Vitreous degeneration, bilateral: Secondary | ICD-10-CM | POA: Diagnosis not present

## 2018-09-05 ENCOUNTER — Encounter (INDEPENDENT_AMBULATORY_CARE_PROVIDER_SITE_OTHER): Payer: Medicare Other | Admitting: Ophthalmology

## 2018-09-05 DIAGNOSIS — H353231 Exudative age-related macular degeneration, bilateral, with active choroidal neovascularization: Secondary | ICD-10-CM

## 2018-09-05 DIAGNOSIS — H43813 Vitreous degeneration, bilateral: Secondary | ICD-10-CM | POA: Diagnosis not present

## 2018-10-10 ENCOUNTER — Encounter (INDEPENDENT_AMBULATORY_CARE_PROVIDER_SITE_OTHER): Payer: Medicare Other | Admitting: Ophthalmology

## 2018-10-29 ENCOUNTER — Encounter (INDEPENDENT_AMBULATORY_CARE_PROVIDER_SITE_OTHER): Payer: Medicare Other | Admitting: Ophthalmology

## 2018-10-29 DIAGNOSIS — H353231 Exudative age-related macular degeneration, bilateral, with active choroidal neovascularization: Secondary | ICD-10-CM | POA: Diagnosis not present

## 2018-10-29 DIAGNOSIS — H43813 Vitreous degeneration, bilateral: Secondary | ICD-10-CM | POA: Diagnosis not present

## 2018-11-24 ENCOUNTER — Other Ambulatory Visit: Payer: Self-pay

## 2018-11-24 ENCOUNTER — Encounter (INDEPENDENT_AMBULATORY_CARE_PROVIDER_SITE_OTHER): Payer: Medicare Other | Admitting: Ophthalmology

## 2018-11-24 DIAGNOSIS — H2513 Age-related nuclear cataract, bilateral: Secondary | ICD-10-CM | POA: Diagnosis not present

## 2018-11-24 DIAGNOSIS — H43813 Vitreous degeneration, bilateral: Secondary | ICD-10-CM

## 2018-11-24 DIAGNOSIS — H353231 Exudative age-related macular degeneration, bilateral, with active choroidal neovascularization: Secondary | ICD-10-CM | POA: Diagnosis not present

## 2018-12-22 ENCOUNTER — Other Ambulatory Visit: Payer: Self-pay

## 2018-12-22 ENCOUNTER — Encounter (INDEPENDENT_AMBULATORY_CARE_PROVIDER_SITE_OTHER): Payer: Medicare Other | Admitting: Ophthalmology

## 2018-12-22 DIAGNOSIS — I10 Essential (primary) hypertension: Secondary | ICD-10-CM | POA: Diagnosis not present

## 2018-12-22 DIAGNOSIS — H2513 Age-related nuclear cataract, bilateral: Secondary | ICD-10-CM

## 2018-12-22 DIAGNOSIS — H35033 Hypertensive retinopathy, bilateral: Secondary | ICD-10-CM

## 2018-12-22 DIAGNOSIS — H43813 Vitreous degeneration, bilateral: Secondary | ICD-10-CM | POA: Diagnosis not present

## 2018-12-22 DIAGNOSIS — H353231 Exudative age-related macular degeneration, bilateral, with active choroidal neovascularization: Secondary | ICD-10-CM | POA: Diagnosis not present

## 2019-01-19 ENCOUNTER — Other Ambulatory Visit: Payer: Self-pay

## 2019-01-19 ENCOUNTER — Encounter (INDEPENDENT_AMBULATORY_CARE_PROVIDER_SITE_OTHER): Payer: Medicare Other | Admitting: Ophthalmology

## 2019-01-19 DIAGNOSIS — H353231 Exudative age-related macular degeneration, bilateral, with active choroidal neovascularization: Secondary | ICD-10-CM | POA: Diagnosis not present

## 2019-01-19 DIAGNOSIS — H43813 Vitreous degeneration, bilateral: Secondary | ICD-10-CM

## 2019-01-19 DIAGNOSIS — H2513 Age-related nuclear cataract, bilateral: Secondary | ICD-10-CM

## 2019-02-16 ENCOUNTER — Encounter (INDEPENDENT_AMBULATORY_CARE_PROVIDER_SITE_OTHER): Payer: Medicare Other | Admitting: Ophthalmology

## 2019-02-16 ENCOUNTER — Other Ambulatory Visit: Payer: Self-pay

## 2019-02-16 DIAGNOSIS — H43813 Vitreous degeneration, bilateral: Secondary | ICD-10-CM

## 2019-02-16 DIAGNOSIS — H353231 Exudative age-related macular degeneration, bilateral, with active choroidal neovascularization: Secondary | ICD-10-CM

## 2019-03-16 ENCOUNTER — Encounter (INDEPENDENT_AMBULATORY_CARE_PROVIDER_SITE_OTHER): Payer: Medicare Other | Admitting: Ophthalmology

## 2019-03-16 ENCOUNTER — Other Ambulatory Visit: Payer: Self-pay

## 2019-03-16 DIAGNOSIS — H353231 Exudative age-related macular degeneration, bilateral, with active choroidal neovascularization: Secondary | ICD-10-CM | POA: Diagnosis not present

## 2019-03-16 DIAGNOSIS — H2513 Age-related nuclear cataract, bilateral: Secondary | ICD-10-CM

## 2019-03-16 DIAGNOSIS — H43813 Vitreous degeneration, bilateral: Secondary | ICD-10-CM

## 2019-04-13 ENCOUNTER — Encounter (INDEPENDENT_AMBULATORY_CARE_PROVIDER_SITE_OTHER): Payer: Medicare Other | Admitting: Ophthalmology

## 2019-04-13 ENCOUNTER — Other Ambulatory Visit: Payer: Self-pay

## 2019-04-13 DIAGNOSIS — H43813 Vitreous degeneration, bilateral: Secondary | ICD-10-CM

## 2019-04-13 DIAGNOSIS — H353231 Exudative age-related macular degeneration, bilateral, with active choroidal neovascularization: Secondary | ICD-10-CM

## 2019-04-13 DIAGNOSIS — H2513 Age-related nuclear cataract, bilateral: Secondary | ICD-10-CM

## 2019-05-12 ENCOUNTER — Other Ambulatory Visit: Payer: Self-pay

## 2019-05-12 ENCOUNTER — Encounter (INDEPENDENT_AMBULATORY_CARE_PROVIDER_SITE_OTHER): Payer: Medicare Other | Admitting: Ophthalmology

## 2019-05-12 DIAGNOSIS — H353231 Exudative age-related macular degeneration, bilateral, with active choroidal neovascularization: Secondary | ICD-10-CM | POA: Diagnosis not present

## 2019-05-12 DIAGNOSIS — H2513 Age-related nuclear cataract, bilateral: Secondary | ICD-10-CM | POA: Diagnosis not present

## 2019-05-12 DIAGNOSIS — H43813 Vitreous degeneration, bilateral: Secondary | ICD-10-CM | POA: Diagnosis not present

## 2019-05-28 DIAGNOSIS — Z23 Encounter for immunization: Secondary | ICD-10-CM | POA: Diagnosis not present

## 2019-06-01 DIAGNOSIS — Z0000 Encounter for general adult medical examination without abnormal findings: Secondary | ICD-10-CM | POA: Diagnosis not present

## 2019-06-01 DIAGNOSIS — D509 Iron deficiency anemia, unspecified: Secondary | ICD-10-CM | POA: Diagnosis not present

## 2019-06-01 DIAGNOSIS — D649 Anemia, unspecified: Secondary | ICD-10-CM | POA: Diagnosis not present

## 2019-06-01 DIAGNOSIS — Z23 Encounter for immunization: Secondary | ICD-10-CM | POA: Diagnosis not present

## 2019-06-09 ENCOUNTER — Other Ambulatory Visit: Payer: Self-pay

## 2019-06-09 ENCOUNTER — Encounter (INDEPENDENT_AMBULATORY_CARE_PROVIDER_SITE_OTHER): Payer: Medicare Other | Admitting: Ophthalmology

## 2019-06-09 DIAGNOSIS — H353231 Exudative age-related macular degeneration, bilateral, with active choroidal neovascularization: Secondary | ICD-10-CM

## 2019-06-09 DIAGNOSIS — H43813 Vitreous degeneration, bilateral: Secondary | ICD-10-CM

## 2019-06-10 DIAGNOSIS — D649 Anemia, unspecified: Secondary | ICD-10-CM | POA: Diagnosis not present

## 2019-06-10 DIAGNOSIS — Z0000 Encounter for general adult medical examination without abnormal findings: Secondary | ICD-10-CM | POA: Diagnosis not present

## 2019-06-10 DIAGNOSIS — H3530 Unspecified macular degeneration: Secondary | ICD-10-CM | POA: Diagnosis not present

## 2019-06-10 DIAGNOSIS — R011 Cardiac murmur, unspecified: Secondary | ICD-10-CM | POA: Diagnosis not present

## 2019-06-10 DIAGNOSIS — Z9889 Other specified postprocedural states: Secondary | ICD-10-CM | POA: Diagnosis not present

## 2019-06-10 DIAGNOSIS — S76912A Strain of unspecified muscles, fascia and tendons at thigh level, left thigh, initial encounter: Secondary | ICD-10-CM | POA: Diagnosis not present

## 2019-06-10 DIAGNOSIS — R030 Elevated blood-pressure reading, without diagnosis of hypertension: Secondary | ICD-10-CM | POA: Diagnosis not present

## 2019-06-10 DIAGNOSIS — Z720 Tobacco use: Secondary | ICD-10-CM | POA: Diagnosis not present

## 2019-07-07 ENCOUNTER — Encounter (INDEPENDENT_AMBULATORY_CARE_PROVIDER_SITE_OTHER): Payer: Medicare Other | Admitting: Ophthalmology

## 2019-07-07 ENCOUNTER — Other Ambulatory Visit: Payer: Self-pay

## 2019-07-07 DIAGNOSIS — H43813 Vitreous degeneration, bilateral: Secondary | ICD-10-CM

## 2019-07-07 DIAGNOSIS — H353231 Exudative age-related macular degeneration, bilateral, with active choroidal neovascularization: Secondary | ICD-10-CM | POA: Diagnosis not present

## 2019-08-03 ENCOUNTER — Encounter (INDEPENDENT_AMBULATORY_CARE_PROVIDER_SITE_OTHER): Payer: Medicare Other | Admitting: Ophthalmology

## 2019-08-03 DIAGNOSIS — H353231 Exudative age-related macular degeneration, bilateral, with active choroidal neovascularization: Secondary | ICD-10-CM | POA: Diagnosis not present

## 2019-08-03 DIAGNOSIS — H43813 Vitreous degeneration, bilateral: Secondary | ICD-10-CM

## 2019-08-03 DIAGNOSIS — H2513 Age-related nuclear cataract, bilateral: Secondary | ICD-10-CM | POA: Diagnosis not present

## 2019-09-07 ENCOUNTER — Encounter (INDEPENDENT_AMBULATORY_CARE_PROVIDER_SITE_OTHER): Payer: Medicare Other | Admitting: Ophthalmology

## 2019-09-07 DIAGNOSIS — H353231 Exudative age-related macular degeneration, bilateral, with active choroidal neovascularization: Secondary | ICD-10-CM

## 2019-09-07 DIAGNOSIS — H43813 Vitreous degeneration, bilateral: Secondary | ICD-10-CM

## 2019-09-07 DIAGNOSIS — H2513 Age-related nuclear cataract, bilateral: Secondary | ICD-10-CM

## 2019-09-24 DIAGNOSIS — Z23 Encounter for immunization: Secondary | ICD-10-CM | POA: Diagnosis not present

## 2019-10-05 ENCOUNTER — Encounter (INDEPENDENT_AMBULATORY_CARE_PROVIDER_SITE_OTHER): Payer: Medicare Other | Admitting: Ophthalmology

## 2019-10-05 ENCOUNTER — Other Ambulatory Visit: Payer: Self-pay

## 2019-10-05 DIAGNOSIS — H2513 Age-related nuclear cataract, bilateral: Secondary | ICD-10-CM | POA: Diagnosis not present

## 2019-10-05 DIAGNOSIS — H43813 Vitreous degeneration, bilateral: Secondary | ICD-10-CM

## 2019-10-05 DIAGNOSIS — H353231 Exudative age-related macular degeneration, bilateral, with active choroidal neovascularization: Secondary | ICD-10-CM

## 2019-10-26 DIAGNOSIS — Z23 Encounter for immunization: Secondary | ICD-10-CM | POA: Diagnosis not present

## 2019-11-02 ENCOUNTER — Encounter (INDEPENDENT_AMBULATORY_CARE_PROVIDER_SITE_OTHER): Payer: Medicare Other | Admitting: Ophthalmology

## 2019-11-02 ENCOUNTER — Other Ambulatory Visit: Payer: Self-pay

## 2019-11-02 DIAGNOSIS — H353231 Exudative age-related macular degeneration, bilateral, with active choroidal neovascularization: Secondary | ICD-10-CM | POA: Diagnosis not present

## 2019-11-02 DIAGNOSIS — H43813 Vitreous degeneration, bilateral: Secondary | ICD-10-CM | POA: Diagnosis not present

## 2019-12-07 ENCOUNTER — Other Ambulatory Visit: Payer: Self-pay

## 2019-12-07 ENCOUNTER — Encounter (INDEPENDENT_AMBULATORY_CARE_PROVIDER_SITE_OTHER): Payer: Medicare Other | Admitting: Ophthalmology

## 2019-12-07 DIAGNOSIS — H43813 Vitreous degeneration, bilateral: Secondary | ICD-10-CM | POA: Diagnosis not present

## 2019-12-07 DIAGNOSIS — H353231 Exudative age-related macular degeneration, bilateral, with active choroidal neovascularization: Secondary | ICD-10-CM | POA: Diagnosis not present

## 2020-01-04 ENCOUNTER — Encounter (INDEPENDENT_AMBULATORY_CARE_PROVIDER_SITE_OTHER): Payer: Medicare Other | Admitting: Ophthalmology

## 2020-01-04 ENCOUNTER — Other Ambulatory Visit: Payer: Self-pay

## 2020-01-04 DIAGNOSIS — H353231 Exudative age-related macular degeneration, bilateral, with active choroidal neovascularization: Secondary | ICD-10-CM | POA: Diagnosis not present

## 2020-01-04 DIAGNOSIS — H43813 Vitreous degeneration, bilateral: Secondary | ICD-10-CM | POA: Diagnosis not present

## 2020-02-04 ENCOUNTER — Encounter (INDEPENDENT_AMBULATORY_CARE_PROVIDER_SITE_OTHER): Payer: Medicare Other | Admitting: Ophthalmology

## 2020-02-04 ENCOUNTER — Other Ambulatory Visit: Payer: Self-pay

## 2020-02-04 DIAGNOSIS — H43813 Vitreous degeneration, bilateral: Secondary | ICD-10-CM

## 2020-02-04 DIAGNOSIS — H2513 Age-related nuclear cataract, bilateral: Secondary | ICD-10-CM

## 2020-02-04 DIAGNOSIS — H353231 Exudative age-related macular degeneration, bilateral, with active choroidal neovascularization: Secondary | ICD-10-CM

## 2020-03-03 ENCOUNTER — Other Ambulatory Visit: Payer: Self-pay

## 2020-03-03 ENCOUNTER — Encounter (INDEPENDENT_AMBULATORY_CARE_PROVIDER_SITE_OTHER): Payer: Medicare Other | Admitting: Ophthalmology

## 2020-03-03 DIAGNOSIS — H43813 Vitreous degeneration, bilateral: Secondary | ICD-10-CM

## 2020-03-03 DIAGNOSIS — H353231 Exudative age-related macular degeneration, bilateral, with active choroidal neovascularization: Secondary | ICD-10-CM

## 2020-03-31 ENCOUNTER — Other Ambulatory Visit: Payer: Self-pay

## 2020-03-31 ENCOUNTER — Encounter (INDEPENDENT_AMBULATORY_CARE_PROVIDER_SITE_OTHER): Payer: Medicare Other | Admitting: Ophthalmology

## 2020-03-31 DIAGNOSIS — H353231 Exudative age-related macular degeneration, bilateral, with active choroidal neovascularization: Secondary | ICD-10-CM | POA: Diagnosis not present

## 2020-03-31 DIAGNOSIS — H43813 Vitreous degeneration, bilateral: Secondary | ICD-10-CM | POA: Diagnosis not present

## 2020-04-26 DIAGNOSIS — I10 Essential (primary) hypertension: Secondary | ICD-10-CM | POA: Diagnosis not present

## 2020-04-28 ENCOUNTER — Other Ambulatory Visit: Payer: Self-pay

## 2020-04-28 ENCOUNTER — Encounter (INDEPENDENT_AMBULATORY_CARE_PROVIDER_SITE_OTHER): Payer: Medicare Other | Admitting: Ophthalmology

## 2020-04-28 DIAGNOSIS — H43813 Vitreous degeneration, bilateral: Secondary | ICD-10-CM

## 2020-04-28 DIAGNOSIS — H2513 Age-related nuclear cataract, bilateral: Secondary | ICD-10-CM

## 2020-04-28 DIAGNOSIS — H353231 Exudative age-related macular degeneration, bilateral, with active choroidal neovascularization: Secondary | ICD-10-CM

## 2020-05-26 ENCOUNTER — Other Ambulatory Visit: Payer: Self-pay

## 2020-05-26 ENCOUNTER — Encounter (INDEPENDENT_AMBULATORY_CARE_PROVIDER_SITE_OTHER): Payer: Medicare Other | Admitting: Ophthalmology

## 2020-05-26 DIAGNOSIS — H43813 Vitreous degeneration, bilateral: Secondary | ICD-10-CM | POA: Diagnosis not present

## 2020-05-26 DIAGNOSIS — H353231 Exudative age-related macular degeneration, bilateral, with active choroidal neovascularization: Secondary | ICD-10-CM | POA: Diagnosis not present

## 2020-06-09 DIAGNOSIS — Z720 Tobacco use: Secondary | ICD-10-CM | POA: Diagnosis not present

## 2020-06-09 DIAGNOSIS — S76912A Strain of unspecified muscles, fascia and tendons at thigh level, left thigh, initial encounter: Secondary | ICD-10-CM | POA: Diagnosis not present

## 2020-06-09 DIAGNOSIS — R011 Cardiac murmur, unspecified: Secondary | ICD-10-CM | POA: Diagnosis not present

## 2020-06-09 DIAGNOSIS — Z23 Encounter for immunization: Secondary | ICD-10-CM | POA: Diagnosis not present

## 2020-06-09 DIAGNOSIS — I10 Essential (primary) hypertension: Secondary | ICD-10-CM | POA: Diagnosis not present

## 2020-06-09 DIAGNOSIS — Z6821 Body mass index (BMI) 21.0-21.9, adult: Secondary | ICD-10-CM | POA: Diagnosis not present

## 2020-06-09 DIAGNOSIS — Z9889 Other specified postprocedural states: Secondary | ICD-10-CM | POA: Diagnosis not present

## 2020-06-09 DIAGNOSIS — D509 Iron deficiency anemia, unspecified: Secondary | ICD-10-CM | POA: Diagnosis not present

## 2020-06-09 DIAGNOSIS — M19041 Primary osteoarthritis, right hand: Secondary | ICD-10-CM | POA: Diagnosis not present

## 2020-06-09 DIAGNOSIS — H3530 Unspecified macular degeneration: Secondary | ICD-10-CM | POA: Diagnosis not present

## 2020-06-09 DIAGNOSIS — R030 Elevated blood-pressure reading, without diagnosis of hypertension: Secondary | ICD-10-CM | POA: Diagnosis not present

## 2020-06-09 DIAGNOSIS — D649 Anemia, unspecified: Secondary | ICD-10-CM | POA: Diagnosis not present

## 2020-06-15 DIAGNOSIS — Z720 Tobacco use: Secondary | ICD-10-CM | POA: Diagnosis not present

## 2020-06-15 DIAGNOSIS — Z0001 Encounter for general adult medical examination with abnormal findings: Secondary | ICD-10-CM | POA: Diagnosis not present

## 2020-06-15 DIAGNOSIS — S76912A Strain of unspecified muscles, fascia and tendons at thigh level, left thigh, initial encounter: Secondary | ICD-10-CM | POA: Diagnosis not present

## 2020-06-15 DIAGNOSIS — H3530 Unspecified macular degeneration: Secondary | ICD-10-CM | POA: Diagnosis not present

## 2020-06-15 DIAGNOSIS — Z9889 Other specified postprocedural states: Secondary | ICD-10-CM | POA: Diagnosis not present

## 2020-06-15 DIAGNOSIS — Z716 Tobacco abuse counseling: Secondary | ICD-10-CM | POA: Diagnosis not present

## 2020-06-15 DIAGNOSIS — R011 Cardiac murmur, unspecified: Secondary | ICD-10-CM | POA: Diagnosis not present

## 2020-06-15 DIAGNOSIS — D649 Anemia, unspecified: Secondary | ICD-10-CM | POA: Diagnosis not present

## 2020-06-15 DIAGNOSIS — R030 Elevated blood-pressure reading, without diagnosis of hypertension: Secondary | ICD-10-CM | POA: Diagnosis not present

## 2020-06-30 ENCOUNTER — Encounter (INDEPENDENT_AMBULATORY_CARE_PROVIDER_SITE_OTHER): Payer: Medicare Other | Admitting: Ophthalmology

## 2020-06-30 ENCOUNTER — Other Ambulatory Visit: Payer: Self-pay

## 2020-06-30 DIAGNOSIS — H353231 Exudative age-related macular degeneration, bilateral, with active choroidal neovascularization: Secondary | ICD-10-CM

## 2020-06-30 DIAGNOSIS — H43813 Vitreous degeneration, bilateral: Secondary | ICD-10-CM

## 2020-07-07 DIAGNOSIS — Z23 Encounter for immunization: Secondary | ICD-10-CM | POA: Diagnosis not present

## 2020-08-04 ENCOUNTER — Other Ambulatory Visit: Payer: Self-pay

## 2020-08-04 ENCOUNTER — Encounter (INDEPENDENT_AMBULATORY_CARE_PROVIDER_SITE_OTHER): Payer: Medicare Other | Admitting: Ophthalmology

## 2020-08-04 DIAGNOSIS — H43813 Vitreous degeneration, bilateral: Secondary | ICD-10-CM

## 2020-08-04 DIAGNOSIS — H353231 Exudative age-related macular degeneration, bilateral, with active choroidal neovascularization: Secondary | ICD-10-CM | POA: Diagnosis not present

## 2020-08-05 ENCOUNTER — Encounter (INDEPENDENT_AMBULATORY_CARE_PROVIDER_SITE_OTHER): Payer: Medicare Other | Admitting: Ophthalmology

## 2020-09-08 ENCOUNTER — Other Ambulatory Visit: Payer: Self-pay

## 2020-09-08 ENCOUNTER — Encounter (INDEPENDENT_AMBULATORY_CARE_PROVIDER_SITE_OTHER): Payer: Medicare Other | Admitting: Ophthalmology

## 2020-09-08 DIAGNOSIS — H43813 Vitreous degeneration, bilateral: Secondary | ICD-10-CM | POA: Diagnosis not present

## 2020-09-08 DIAGNOSIS — H353231 Exudative age-related macular degeneration, bilateral, with active choroidal neovascularization: Secondary | ICD-10-CM | POA: Diagnosis not present

## 2020-10-20 ENCOUNTER — Other Ambulatory Visit: Payer: Self-pay

## 2020-10-20 ENCOUNTER — Encounter (INDEPENDENT_AMBULATORY_CARE_PROVIDER_SITE_OTHER): Payer: Medicare Other | Admitting: Ophthalmology

## 2020-10-20 DIAGNOSIS — H43813 Vitreous degeneration, bilateral: Secondary | ICD-10-CM

## 2020-10-20 DIAGNOSIS — H353231 Exudative age-related macular degeneration, bilateral, with active choroidal neovascularization: Secondary | ICD-10-CM

## 2020-10-20 DIAGNOSIS — H2510 Age-related nuclear cataract, unspecified eye: Secondary | ICD-10-CM | POA: Diagnosis not present

## 2020-12-01 ENCOUNTER — Other Ambulatory Visit: Payer: Self-pay

## 2020-12-01 ENCOUNTER — Encounter (INDEPENDENT_AMBULATORY_CARE_PROVIDER_SITE_OTHER): Payer: Medicare Other | Admitting: Ophthalmology

## 2020-12-01 DIAGNOSIS — H353231 Exudative age-related macular degeneration, bilateral, with active choroidal neovascularization: Secondary | ICD-10-CM | POA: Diagnosis not present

## 2020-12-01 DIAGNOSIS — H43813 Vitreous degeneration, bilateral: Secondary | ICD-10-CM

## 2020-12-28 DIAGNOSIS — Z23 Encounter for immunization: Secondary | ICD-10-CM | POA: Diagnosis not present

## 2021-01-12 ENCOUNTER — Encounter (INDEPENDENT_AMBULATORY_CARE_PROVIDER_SITE_OTHER): Payer: Medicare Other | Admitting: Ophthalmology

## 2021-01-12 ENCOUNTER — Other Ambulatory Visit: Payer: Self-pay

## 2021-01-12 DIAGNOSIS — H43813 Vitreous degeneration, bilateral: Secondary | ICD-10-CM

## 2021-01-12 DIAGNOSIS — H353231 Exudative age-related macular degeneration, bilateral, with active choroidal neovascularization: Secondary | ICD-10-CM | POA: Diagnosis not present

## 2021-01-23 DIAGNOSIS — I10 Essential (primary) hypertension: Secondary | ICD-10-CM | POA: Diagnosis not present

## 2021-01-23 DIAGNOSIS — D509 Iron deficiency anemia, unspecified: Secondary | ICD-10-CM | POA: Diagnosis not present

## 2021-02-02 DIAGNOSIS — H3530 Unspecified macular degeneration: Secondary | ICD-10-CM | POA: Diagnosis not present

## 2021-02-02 DIAGNOSIS — D649 Anemia, unspecified: Secondary | ICD-10-CM | POA: Diagnosis not present

## 2021-02-02 DIAGNOSIS — I10 Essential (primary) hypertension: Secondary | ICD-10-CM | POA: Diagnosis not present

## 2021-02-02 DIAGNOSIS — R011 Cardiac murmur, unspecified: Secondary | ICD-10-CM | POA: Diagnosis not present

## 2021-02-02 DIAGNOSIS — F17200 Nicotine dependence, unspecified, uncomplicated: Secondary | ICD-10-CM | POA: Diagnosis not present

## 2021-02-23 ENCOUNTER — Encounter (INDEPENDENT_AMBULATORY_CARE_PROVIDER_SITE_OTHER): Payer: Medicare Other | Admitting: Ophthalmology

## 2021-02-23 ENCOUNTER — Other Ambulatory Visit: Payer: Self-pay

## 2021-02-23 DIAGNOSIS — H43813 Vitreous degeneration, bilateral: Secondary | ICD-10-CM

## 2021-02-23 DIAGNOSIS — H353231 Exudative age-related macular degeneration, bilateral, with active choroidal neovascularization: Secondary | ICD-10-CM

## 2021-03-30 ENCOUNTER — Other Ambulatory Visit: Payer: Self-pay

## 2021-03-30 ENCOUNTER — Encounter (INDEPENDENT_AMBULATORY_CARE_PROVIDER_SITE_OTHER): Payer: Medicare Other | Admitting: Ophthalmology

## 2021-03-30 DIAGNOSIS — H353231 Exudative age-related macular degeneration, bilateral, with active choroidal neovascularization: Secondary | ICD-10-CM | POA: Diagnosis not present

## 2021-03-30 DIAGNOSIS — H35033 Hypertensive retinopathy, bilateral: Secondary | ICD-10-CM | POA: Diagnosis not present

## 2021-03-30 DIAGNOSIS — I10 Essential (primary) hypertension: Secondary | ICD-10-CM

## 2021-03-30 DIAGNOSIS — H43813 Vitreous degeneration, bilateral: Secondary | ICD-10-CM | POA: Diagnosis not present

## 2021-05-11 ENCOUNTER — Other Ambulatory Visit: Payer: Self-pay

## 2021-05-11 ENCOUNTER — Encounter (INDEPENDENT_AMBULATORY_CARE_PROVIDER_SITE_OTHER): Payer: Medicare Other | Admitting: Ophthalmology

## 2021-05-11 DIAGNOSIS — H43813 Vitreous degeneration, bilateral: Secondary | ICD-10-CM

## 2021-05-11 DIAGNOSIS — I10 Essential (primary) hypertension: Secondary | ICD-10-CM

## 2021-05-11 DIAGNOSIS — H35033 Hypertensive retinopathy, bilateral: Secondary | ICD-10-CM

## 2021-05-11 DIAGNOSIS — H353231 Exudative age-related macular degeneration, bilateral, with active choroidal neovascularization: Secondary | ICD-10-CM | POA: Diagnosis not present

## 2021-05-23 ENCOUNTER — Encounter (INDEPENDENT_AMBULATORY_CARE_PROVIDER_SITE_OTHER): Payer: Medicare Other | Admitting: Ophthalmology

## 2021-06-14 DIAGNOSIS — Z23 Encounter for immunization: Secondary | ICD-10-CM | POA: Diagnosis not present

## 2021-06-22 ENCOUNTER — Encounter (INDEPENDENT_AMBULATORY_CARE_PROVIDER_SITE_OTHER): Payer: Medicare Other | Admitting: Ophthalmology

## 2021-06-22 ENCOUNTER — Other Ambulatory Visit: Payer: Self-pay

## 2021-06-22 DIAGNOSIS — I10 Essential (primary) hypertension: Secondary | ICD-10-CM | POA: Diagnosis not present

## 2021-06-22 DIAGNOSIS — H35033 Hypertensive retinopathy, bilateral: Secondary | ICD-10-CM | POA: Diagnosis not present

## 2021-06-22 DIAGNOSIS — H43813 Vitreous degeneration, bilateral: Secondary | ICD-10-CM | POA: Diagnosis not present

## 2021-06-22 DIAGNOSIS — H353231 Exudative age-related macular degeneration, bilateral, with active choroidal neovascularization: Secondary | ICD-10-CM

## 2021-06-29 DIAGNOSIS — Z23 Encounter for immunization: Secondary | ICD-10-CM | POA: Diagnosis not present

## 2021-08-03 DIAGNOSIS — D509 Iron deficiency anemia, unspecified: Secondary | ICD-10-CM | POA: Diagnosis not present

## 2021-08-03 DIAGNOSIS — I10 Essential (primary) hypertension: Secondary | ICD-10-CM | POA: Diagnosis not present

## 2021-08-07 DIAGNOSIS — R011 Cardiac murmur, unspecified: Secondary | ICD-10-CM | POA: Diagnosis not present

## 2021-08-07 DIAGNOSIS — F17200 Nicotine dependence, unspecified, uncomplicated: Secondary | ICD-10-CM | POA: Diagnosis not present

## 2021-08-07 DIAGNOSIS — D649 Anemia, unspecified: Secondary | ICD-10-CM | POA: Diagnosis not present

## 2021-08-07 DIAGNOSIS — Z0001 Encounter for general adult medical examination with abnormal findings: Secondary | ICD-10-CM | POA: Diagnosis not present

## 2021-08-07 DIAGNOSIS — I10 Essential (primary) hypertension: Secondary | ICD-10-CM | POA: Diagnosis not present

## 2021-08-07 DIAGNOSIS — H3530 Unspecified macular degeneration: Secondary | ICD-10-CM | POA: Diagnosis not present

## 2021-08-10 ENCOUNTER — Encounter (INDEPENDENT_AMBULATORY_CARE_PROVIDER_SITE_OTHER): Payer: Medicare Other | Admitting: Ophthalmology

## 2021-08-10 ENCOUNTER — Other Ambulatory Visit: Payer: Self-pay

## 2021-08-10 DIAGNOSIS — H353231 Exudative age-related macular degeneration, bilateral, with active choroidal neovascularization: Secondary | ICD-10-CM | POA: Diagnosis not present

## 2021-08-10 DIAGNOSIS — I10 Essential (primary) hypertension: Secondary | ICD-10-CM | POA: Diagnosis not present

## 2021-08-10 DIAGNOSIS — H35033 Hypertensive retinopathy, bilateral: Secondary | ICD-10-CM

## 2021-08-10 DIAGNOSIS — H43813 Vitreous degeneration, bilateral: Secondary | ICD-10-CM

## 2021-09-28 ENCOUNTER — Other Ambulatory Visit: Payer: Self-pay

## 2021-09-28 ENCOUNTER — Encounter (INDEPENDENT_AMBULATORY_CARE_PROVIDER_SITE_OTHER): Payer: Medicare Other | Admitting: Ophthalmology

## 2021-09-28 DIAGNOSIS — H35033 Hypertensive retinopathy, bilateral: Secondary | ICD-10-CM

## 2021-09-28 DIAGNOSIS — H43813 Vitreous degeneration, bilateral: Secondary | ICD-10-CM | POA: Diagnosis not present

## 2021-09-28 DIAGNOSIS — I10 Essential (primary) hypertension: Secondary | ICD-10-CM | POA: Diagnosis not present

## 2021-09-28 DIAGNOSIS — H353231 Exudative age-related macular degeneration, bilateral, with active choroidal neovascularization: Secondary | ICD-10-CM

## 2021-11-09 ENCOUNTER — Other Ambulatory Visit: Payer: Self-pay

## 2021-11-09 ENCOUNTER — Encounter (INDEPENDENT_AMBULATORY_CARE_PROVIDER_SITE_OTHER): Payer: Medicare Other | Admitting: Ophthalmology

## 2021-11-09 DIAGNOSIS — H43813 Vitreous degeneration, bilateral: Secondary | ICD-10-CM

## 2021-11-09 DIAGNOSIS — H35033 Hypertensive retinopathy, bilateral: Secondary | ICD-10-CM

## 2021-11-09 DIAGNOSIS — I10 Essential (primary) hypertension: Secondary | ICD-10-CM | POA: Diagnosis not present

## 2021-11-09 DIAGNOSIS — H353231 Exudative age-related macular degeneration, bilateral, with active choroidal neovascularization: Secondary | ICD-10-CM | POA: Diagnosis not present

## 2021-12-21 ENCOUNTER — Encounter (INDEPENDENT_AMBULATORY_CARE_PROVIDER_SITE_OTHER): Payer: Medicare Other | Admitting: Ophthalmology

## 2021-12-21 DIAGNOSIS — H35033 Hypertensive retinopathy, bilateral: Secondary | ICD-10-CM | POA: Diagnosis not present

## 2021-12-21 DIAGNOSIS — H353231 Exudative age-related macular degeneration, bilateral, with active choroidal neovascularization: Secondary | ICD-10-CM

## 2021-12-21 DIAGNOSIS — H43813 Vitreous degeneration, bilateral: Secondary | ICD-10-CM | POA: Diagnosis not present

## 2021-12-21 DIAGNOSIS — I10 Essential (primary) hypertension: Secondary | ICD-10-CM | POA: Diagnosis not present

## 2022-01-31 DIAGNOSIS — Z125 Encounter for screening for malignant neoplasm of prostate: Secondary | ICD-10-CM | POA: Diagnosis not present

## 2022-01-31 DIAGNOSIS — I10 Essential (primary) hypertension: Secondary | ICD-10-CM | POA: Diagnosis not present

## 2022-02-01 ENCOUNTER — Encounter (INDEPENDENT_AMBULATORY_CARE_PROVIDER_SITE_OTHER): Payer: Medicare Other | Admitting: Ophthalmology

## 2022-02-01 DIAGNOSIS — H2513 Age-related nuclear cataract, bilateral: Secondary | ICD-10-CM

## 2022-02-01 DIAGNOSIS — H353231 Exudative age-related macular degeneration, bilateral, with active choroidal neovascularization: Secondary | ICD-10-CM

## 2022-02-01 DIAGNOSIS — H35033 Hypertensive retinopathy, bilateral: Secondary | ICD-10-CM

## 2022-02-01 DIAGNOSIS — H43813 Vitreous degeneration, bilateral: Secondary | ICD-10-CM

## 2022-02-01 DIAGNOSIS — I10 Essential (primary) hypertension: Secondary | ICD-10-CM | POA: Diagnosis not present

## 2022-02-05 DIAGNOSIS — R195 Other fecal abnormalities: Secondary | ICD-10-CM | POA: Diagnosis not present

## 2022-02-05 DIAGNOSIS — D649 Anemia, unspecified: Secondary | ICD-10-CM | POA: Diagnosis not present

## 2022-02-05 DIAGNOSIS — R972 Elevated prostate specific antigen [PSA]: Secondary | ICD-10-CM | POA: Diagnosis not present

## 2022-02-05 DIAGNOSIS — R011 Cardiac murmur, unspecified: Secondary | ICD-10-CM | POA: Diagnosis not present

## 2022-02-05 DIAGNOSIS — F17200 Nicotine dependence, unspecified, uncomplicated: Secondary | ICD-10-CM | POA: Diagnosis not present

## 2022-02-05 DIAGNOSIS — I10 Essential (primary) hypertension: Secondary | ICD-10-CM | POA: Diagnosis not present

## 2022-02-05 DIAGNOSIS — H3530 Unspecified macular degeneration: Secondary | ICD-10-CM | POA: Diagnosis not present

## 2022-03-15 ENCOUNTER — Encounter (INDEPENDENT_AMBULATORY_CARE_PROVIDER_SITE_OTHER): Payer: Medicare Other | Admitting: Ophthalmology

## 2022-03-15 DIAGNOSIS — H353231 Exudative age-related macular degeneration, bilateral, with active choroidal neovascularization: Secondary | ICD-10-CM

## 2022-03-15 DIAGNOSIS — I10 Essential (primary) hypertension: Secondary | ICD-10-CM | POA: Diagnosis not present

## 2022-03-15 DIAGNOSIS — H43813 Vitreous degeneration, bilateral: Secondary | ICD-10-CM | POA: Diagnosis not present

## 2022-03-15 DIAGNOSIS — H35033 Hypertensive retinopathy, bilateral: Secondary | ICD-10-CM | POA: Diagnosis not present

## 2022-04-19 ENCOUNTER — Encounter (INDEPENDENT_AMBULATORY_CARE_PROVIDER_SITE_OTHER): Payer: Medicare Other | Admitting: Ophthalmology

## 2022-04-19 DIAGNOSIS — H43813 Vitreous degeneration, bilateral: Secondary | ICD-10-CM | POA: Diagnosis not present

## 2022-04-19 DIAGNOSIS — H353231 Exudative age-related macular degeneration, bilateral, with active choroidal neovascularization: Secondary | ICD-10-CM

## 2022-04-19 DIAGNOSIS — H35033 Hypertensive retinopathy, bilateral: Secondary | ICD-10-CM | POA: Diagnosis not present

## 2022-04-19 DIAGNOSIS — I10 Essential (primary) hypertension: Secondary | ICD-10-CM | POA: Diagnosis not present

## 2022-05-31 ENCOUNTER — Encounter (INDEPENDENT_AMBULATORY_CARE_PROVIDER_SITE_OTHER): Payer: Medicare Other | Admitting: Ophthalmology

## 2022-05-31 DIAGNOSIS — H353231 Exudative age-related macular degeneration, bilateral, with active choroidal neovascularization: Secondary | ICD-10-CM | POA: Diagnosis not present

## 2022-05-31 DIAGNOSIS — H43813 Vitreous degeneration, bilateral: Secondary | ICD-10-CM

## 2022-05-31 DIAGNOSIS — I10 Essential (primary) hypertension: Secondary | ICD-10-CM

## 2022-05-31 DIAGNOSIS — H35033 Hypertensive retinopathy, bilateral: Secondary | ICD-10-CM

## 2022-06-13 DIAGNOSIS — Z23 Encounter for immunization: Secondary | ICD-10-CM | POA: Diagnosis not present

## 2022-07-12 ENCOUNTER — Encounter (INDEPENDENT_AMBULATORY_CARE_PROVIDER_SITE_OTHER): Payer: Medicare Other | Admitting: Ophthalmology

## 2022-07-12 DIAGNOSIS — H43813 Vitreous degeneration, bilateral: Secondary | ICD-10-CM

## 2022-07-12 DIAGNOSIS — H353231 Exudative age-related macular degeneration, bilateral, with active choroidal neovascularization: Secondary | ICD-10-CM | POA: Diagnosis not present

## 2022-07-12 DIAGNOSIS — H35033 Hypertensive retinopathy, bilateral: Secondary | ICD-10-CM | POA: Diagnosis not present

## 2022-07-12 DIAGNOSIS — I10 Essential (primary) hypertension: Secondary | ICD-10-CM | POA: Diagnosis not present

## 2022-07-31 DIAGNOSIS — I10 Essential (primary) hypertension: Secondary | ICD-10-CM | POA: Diagnosis not present

## 2022-07-31 DIAGNOSIS — D649 Anemia, unspecified: Secondary | ICD-10-CM | POA: Diagnosis not present

## 2022-07-31 DIAGNOSIS — R972 Elevated prostate specific antigen [PSA]: Secondary | ICD-10-CM | POA: Diagnosis not present

## 2022-08-07 DIAGNOSIS — R972 Elevated prostate specific antigen [PSA]: Secondary | ICD-10-CM | POA: Diagnosis not present

## 2022-08-07 DIAGNOSIS — F17200 Nicotine dependence, unspecified, uncomplicated: Secondary | ICD-10-CM | POA: Diagnosis not present

## 2022-08-07 DIAGNOSIS — R809 Proteinuria, unspecified: Secondary | ICD-10-CM | POA: Diagnosis not present

## 2022-08-07 DIAGNOSIS — I10 Essential (primary) hypertension: Secondary | ICD-10-CM | POA: Diagnosis not present

## 2022-08-07 DIAGNOSIS — R011 Cardiac murmur, unspecified: Secondary | ICD-10-CM | POA: Diagnosis not present

## 2022-08-07 DIAGNOSIS — Z0000 Encounter for general adult medical examination without abnormal findings: Secondary | ICD-10-CM | POA: Diagnosis not present

## 2022-08-07 DIAGNOSIS — D649 Anemia, unspecified: Secondary | ICD-10-CM | POA: Diagnosis not present

## 2022-08-07 DIAGNOSIS — R195 Other fecal abnormalities: Secondary | ICD-10-CM | POA: Diagnosis not present

## 2022-08-07 DIAGNOSIS — H3530 Unspecified macular degeneration: Secondary | ICD-10-CM | POA: Diagnosis not present

## 2022-08-23 ENCOUNTER — Encounter (INDEPENDENT_AMBULATORY_CARE_PROVIDER_SITE_OTHER): Payer: Medicare Other | Admitting: Ophthalmology

## 2022-08-23 DIAGNOSIS — H43813 Vitreous degeneration, bilateral: Secondary | ICD-10-CM | POA: Diagnosis not present

## 2022-08-23 DIAGNOSIS — H35033 Hypertensive retinopathy, bilateral: Secondary | ICD-10-CM | POA: Diagnosis not present

## 2022-08-23 DIAGNOSIS — I10 Essential (primary) hypertension: Secondary | ICD-10-CM | POA: Diagnosis not present

## 2022-08-23 DIAGNOSIS — H353231 Exudative age-related macular degeneration, bilateral, with active choroidal neovascularization: Secondary | ICD-10-CM | POA: Diagnosis not present

## 2022-10-04 ENCOUNTER — Encounter (INDEPENDENT_AMBULATORY_CARE_PROVIDER_SITE_OTHER): Payer: Medicare Other | Admitting: Ophthalmology

## 2022-10-04 DIAGNOSIS — H43813 Vitreous degeneration, bilateral: Secondary | ICD-10-CM | POA: Diagnosis not present

## 2022-10-04 DIAGNOSIS — I10 Essential (primary) hypertension: Secondary | ICD-10-CM

## 2022-10-04 DIAGNOSIS — H35033 Hypertensive retinopathy, bilateral: Secondary | ICD-10-CM | POA: Diagnosis not present

## 2022-10-04 DIAGNOSIS — H353231 Exudative age-related macular degeneration, bilateral, with active choroidal neovascularization: Secondary | ICD-10-CM | POA: Diagnosis not present

## 2022-11-22 ENCOUNTER — Encounter (INDEPENDENT_AMBULATORY_CARE_PROVIDER_SITE_OTHER): Payer: Medicare Other | Admitting: Ophthalmology

## 2022-11-22 DIAGNOSIS — I10 Essential (primary) hypertension: Secondary | ICD-10-CM

## 2022-11-22 DIAGNOSIS — H353231 Exudative age-related macular degeneration, bilateral, with active choroidal neovascularization: Secondary | ICD-10-CM | POA: Diagnosis not present

## 2022-11-22 DIAGNOSIS — H43813 Vitreous degeneration, bilateral: Secondary | ICD-10-CM | POA: Diagnosis not present

## 2022-11-22 DIAGNOSIS — H35033 Hypertensive retinopathy, bilateral: Secondary | ICD-10-CM | POA: Diagnosis not present

## 2023-01-10 ENCOUNTER — Encounter (INDEPENDENT_AMBULATORY_CARE_PROVIDER_SITE_OTHER): Payer: Medicare Other | Admitting: Ophthalmology

## 2023-01-10 DIAGNOSIS — H35033 Hypertensive retinopathy, bilateral: Secondary | ICD-10-CM

## 2023-01-10 DIAGNOSIS — I10 Essential (primary) hypertension: Secondary | ICD-10-CM

## 2023-01-10 DIAGNOSIS — H353231 Exudative age-related macular degeneration, bilateral, with active choroidal neovascularization: Secondary | ICD-10-CM | POA: Diagnosis not present

## 2023-01-10 DIAGNOSIS — H2513 Age-related nuclear cataract, bilateral: Secondary | ICD-10-CM | POA: Diagnosis not present

## 2023-01-10 DIAGNOSIS — H43813 Vitreous degeneration, bilateral: Secondary | ICD-10-CM | POA: Diagnosis not present

## 2023-01-31 DIAGNOSIS — R972 Elevated prostate specific antigen [PSA]: Secondary | ICD-10-CM | POA: Diagnosis not present

## 2023-01-31 DIAGNOSIS — D649 Anemia, unspecified: Secondary | ICD-10-CM | POA: Diagnosis not present

## 2023-01-31 DIAGNOSIS — R809 Proteinuria, unspecified: Secondary | ICD-10-CM | POA: Diagnosis not present

## 2023-01-31 DIAGNOSIS — I10 Essential (primary) hypertension: Secondary | ICD-10-CM | POA: Diagnosis not present

## 2023-02-06 DIAGNOSIS — R011 Cardiac murmur, unspecified: Secondary | ICD-10-CM | POA: Diagnosis not present

## 2023-02-06 DIAGNOSIS — F17200 Nicotine dependence, unspecified, uncomplicated: Secondary | ICD-10-CM | POA: Diagnosis not present

## 2023-02-06 DIAGNOSIS — F17210 Nicotine dependence, cigarettes, uncomplicated: Secondary | ICD-10-CM | POA: Diagnosis not present

## 2023-02-06 DIAGNOSIS — R972 Elevated prostate specific antigen [PSA]: Secondary | ICD-10-CM | POA: Diagnosis not present

## 2023-02-06 DIAGNOSIS — R195 Other fecal abnormalities: Secondary | ICD-10-CM | POA: Diagnosis not present

## 2023-02-06 DIAGNOSIS — D649 Anemia, unspecified: Secondary | ICD-10-CM | POA: Diagnosis not present

## 2023-02-06 DIAGNOSIS — H3530 Unspecified macular degeneration: Secondary | ICD-10-CM | POA: Diagnosis not present

## 2023-02-06 DIAGNOSIS — R809 Proteinuria, unspecified: Secondary | ICD-10-CM | POA: Diagnosis not present

## 2023-02-06 DIAGNOSIS — I10 Essential (primary) hypertension: Secondary | ICD-10-CM | POA: Diagnosis not present

## 2023-02-28 ENCOUNTER — Encounter (INDEPENDENT_AMBULATORY_CARE_PROVIDER_SITE_OTHER): Payer: Medicare Other | Admitting: Ophthalmology

## 2023-02-28 DIAGNOSIS — I10 Essential (primary) hypertension: Secondary | ICD-10-CM

## 2023-02-28 DIAGNOSIS — H35033 Hypertensive retinopathy, bilateral: Secondary | ICD-10-CM

## 2023-02-28 DIAGNOSIS — H43813 Vitreous degeneration, bilateral: Secondary | ICD-10-CM

## 2023-02-28 DIAGNOSIS — H353231 Exudative age-related macular degeneration, bilateral, with active choroidal neovascularization: Secondary | ICD-10-CM

## 2023-04-18 ENCOUNTER — Encounter (INDEPENDENT_AMBULATORY_CARE_PROVIDER_SITE_OTHER): Payer: Medicare Other | Admitting: Ophthalmology

## 2023-04-18 DIAGNOSIS — H353231 Exudative age-related macular degeneration, bilateral, with active choroidal neovascularization: Secondary | ICD-10-CM | POA: Diagnosis not present

## 2023-06-06 ENCOUNTER — Encounter (INDEPENDENT_AMBULATORY_CARE_PROVIDER_SITE_OTHER): Payer: Medicare Other | Admitting: Ophthalmology

## 2023-06-06 DIAGNOSIS — H353231 Exudative age-related macular degeneration, bilateral, with active choroidal neovascularization: Secondary | ICD-10-CM

## 2023-06-06 DIAGNOSIS — I10 Essential (primary) hypertension: Secondary | ICD-10-CM | POA: Diagnosis not present

## 2023-06-06 DIAGNOSIS — H35033 Hypertensive retinopathy, bilateral: Secondary | ICD-10-CM

## 2023-06-06 DIAGNOSIS — H43813 Vitreous degeneration, bilateral: Secondary | ICD-10-CM | POA: Diagnosis not present

## 2023-06-25 DIAGNOSIS — Z23 Encounter for immunization: Secondary | ICD-10-CM | POA: Diagnosis not present

## 2023-07-25 ENCOUNTER — Encounter (INDEPENDENT_AMBULATORY_CARE_PROVIDER_SITE_OTHER): Payer: Medicare Other | Admitting: Ophthalmology

## 2023-07-25 DIAGNOSIS — H353231 Exudative age-related macular degeneration, bilateral, with active choroidal neovascularization: Secondary | ICD-10-CM

## 2023-07-25 DIAGNOSIS — H35033 Hypertensive retinopathy, bilateral: Secondary | ICD-10-CM

## 2023-07-25 DIAGNOSIS — H43813 Vitreous degeneration, bilateral: Secondary | ICD-10-CM | POA: Diagnosis not present

## 2023-07-25 DIAGNOSIS — I10 Essential (primary) hypertension: Secondary | ICD-10-CM

## 2023-08-06 DIAGNOSIS — I10 Essential (primary) hypertension: Secondary | ICD-10-CM | POA: Diagnosis not present

## 2023-08-06 DIAGNOSIS — R809 Proteinuria, unspecified: Secondary | ICD-10-CM | POA: Diagnosis not present

## 2023-08-06 DIAGNOSIS — D649 Anemia, unspecified: Secondary | ICD-10-CM | POA: Diagnosis not present

## 2023-08-06 DIAGNOSIS — R972 Elevated prostate specific antigen [PSA]: Secondary | ICD-10-CM | POA: Diagnosis not present

## 2023-08-12 DIAGNOSIS — R809 Proteinuria, unspecified: Secondary | ICD-10-CM | POA: Diagnosis not present

## 2023-08-12 DIAGNOSIS — I10 Essential (primary) hypertension: Secondary | ICD-10-CM | POA: Diagnosis not present

## 2023-08-12 DIAGNOSIS — D649 Anemia, unspecified: Secondary | ICD-10-CM | POA: Diagnosis not present

## 2023-08-12 DIAGNOSIS — R011 Cardiac murmur, unspecified: Secondary | ICD-10-CM | POA: Diagnosis not present

## 2023-08-12 DIAGNOSIS — R972 Elevated prostate specific antigen [PSA]: Secondary | ICD-10-CM | POA: Diagnosis not present

## 2023-08-12 DIAGNOSIS — R195 Other fecal abnormalities: Secondary | ICD-10-CM | POA: Diagnosis not present

## 2023-08-12 DIAGNOSIS — H3530 Unspecified macular degeneration: Secondary | ICD-10-CM | POA: Diagnosis not present

## 2023-08-12 DIAGNOSIS — F17200 Nicotine dependence, unspecified, uncomplicated: Secondary | ICD-10-CM | POA: Diagnosis not present

## 2023-09-18 ENCOUNTER — Ambulatory Visit (INDEPENDENT_AMBULATORY_CARE_PROVIDER_SITE_OTHER): Payer: Medicare Other | Admitting: Urology

## 2023-09-18 ENCOUNTER — Encounter: Payer: Self-pay | Admitting: Urology

## 2023-09-18 VITALS — BP 162/96 | HR 61

## 2023-09-18 DIAGNOSIS — R3129 Other microscopic hematuria: Secondary | ICD-10-CM

## 2023-09-18 DIAGNOSIS — R972 Elevated prostate specific antigen [PSA]: Secondary | ICD-10-CM | POA: Diagnosis not present

## 2023-09-18 LAB — URINALYSIS, ROUTINE W REFLEX MICROSCOPIC
Bilirubin, UA: NEGATIVE
Glucose, UA: NEGATIVE
Ketones, UA: NEGATIVE
Leukocytes,UA: NEGATIVE
Nitrite, UA: NEGATIVE
Protein,UA: NEGATIVE
RBC, UA: NEGATIVE
Specific Gravity, UA: 1.02 (ref 1.005–1.030)
Urobilinogen, Ur: 0.2 mg/dL (ref 0.2–1.0)
pH, UA: 7.5 (ref 5.0–7.5)

## 2023-09-18 NOTE — Progress Notes (Signed)
09/18/2023 11:18 AM   Daun Peacock 1941-08-22 [id number]  Referring provider: Benita Stabile, MD [address] [place],  [place] [address]  Elevated PSA   HPI: [name] is a 83yo here for evaluation of elevated PSA.  His PSA was 6.3. His father had prostate cancer and died at age 62. His 3 brothers had prostate cancer and 1 had metastatic prostate cancer at age 65. No significant LUTS.    PMH: No past medical history on file.  Surgical History: Past Surgical History:  Procedure Laterality Date   HERNIA REPAIR     Right side   INGUINAL HERNIA REPAIR Left 07/16/2016   Procedure: HERNIA REPAIR INGUINAL ADULT WITH MESH;  Surgeon: Franky Macho, MD;  Location: [facility];  Service: General;  Laterality: Left;    Home Medications:  Allergies as of 09/18/2023       Reactions   Shellfish Allergy Swelling        Medication List        Accurate as of September 18, 2023 11:18 AM. If you have any questions, ask your nurse or doctor.          ascorbic acid 500 MG tablet Commonly known as: VITAMIN C Take 500 mg by mouth daily.   HYDROcodone-acetaminophen 5-325 MG tablet Commonly known as: Norco Take 1 tablet by mouth every 6 (six) hours as needed for moderate pain.   losartan 50 MG tablet Commonly known as: COZAAR Take 50 mg by mouth daily.   PRESERVISION AREDS PO Take 1 capsule by mouth daily.   pseudoephedrine-acetaminophen 30-500 MG Tabs tablet Commonly known as: TYLENOL SINUS Take 1 tablet by mouth every 6 (six) hours as needed.        Allergies:  Allergies  Allergen Reactions   Shellfish Allergy Swelling    Family History: No family history on file.  Social History:  reports that he has been smoking. He has a 25 pack-year smoking history. He has never used smokeless tobacco. He reports that he does not drink alcohol and does not use drugs.  ROS: All other review of systems were reviewed and are negative except what is noted above in  HPI  Physical Exam: BP (!) 162/96   Pulse 61   Constitutional:  Alert and oriented, No acute distress. HEENT: [place] AT, moist mucus membranes.  Trachea midline, no masses. Cardiovascular: No clubbing, cyanosis, or edema. Respiratory: Normal respiratory effort, no increased work of breathing. GI: Abdomen is soft, nontender, nondistended, no abdominal masses GU: No CVA tenderness.  Lymph: No cervical or inguinal lymphadenopathy. Skin: No rashes, bruises or suspicious lesions. Neurologic: Grossly intact, no focal deficits, moving all 4 extremities. Psychiatric: Normal mood and affect.  Laboratory Data: Lab Results  Component Value Date   WBC 6.2 07/12/2016   HGB 12.9 (L) 07/12/2016   HCT 39.2 07/12/2016   MCV 93.8 07/12/2016   PLT 232 07/12/2016    Lab Results  Component Value Date   CREATININE 0.84 07/12/2016    No results found for: "PSA"  No results found for: "TESTOSTERONE"  No results found for: "HGBA1C"  Urinalysis No results found for: "COLORURINE", "APPEARANCEUR", "LABSPEC", "PHURINE", "GLUCOSEU", "HGBUR", "BILIRUBINUR", "KETONESUR", "PROTEINUR", "UROBILINOGEN", "NITRITE", "LEUKOCYTESUR"  No results found for: "LABMICR", "WBCUA", "RBCUA", "LABEPIT", "MUCUS", "BACTERIA"  Pertinent Imaging:  No results found for this or any previous visit.  No results found for this or any previous visit.  No results found for this or any previous visit.  No results found  for this or any previous visit.  No results found for this or any previous visit.  No results found for this or any previous visit.  No results found for this or any previous visit.  No results found for this or any previous visit.   Assessment & Plan:    1. Elevated PSA (Primary) IsoPSA today, will call with results. If the Iso PSA is normal I will see him back in 3 months with a PSA.    No follow-ups on file.  Wilkie Aye, MD  [facility] [place]

## 2023-09-18 NOTE — Patient Instructions (Signed)

## 2023-09-19 ENCOUNTER — Encounter (INDEPENDENT_AMBULATORY_CARE_PROVIDER_SITE_OTHER): Payer: Medicare Other | Admitting: Ophthalmology

## 2023-09-19 DIAGNOSIS — I10 Essential (primary) hypertension: Secondary | ICD-10-CM | POA: Diagnosis not present

## 2023-09-19 DIAGNOSIS — H43813 Vitreous degeneration, bilateral: Secondary | ICD-10-CM

## 2023-09-19 DIAGNOSIS — H35033 Hypertensive retinopathy, bilateral: Secondary | ICD-10-CM | POA: Diagnosis not present

## 2023-09-19 DIAGNOSIS — H353231 Exudative age-related macular degeneration, bilateral, with active choroidal neovascularization: Secondary | ICD-10-CM | POA: Diagnosis not present

## 2023-09-23 ENCOUNTER — Encounter: Payer: Self-pay | Admitting: Urology

## 2023-09-26 ENCOUNTER — Telehealth: Payer: Self-pay

## 2023-09-26 ENCOUNTER — Telehealth: Payer: Self-pay | Admitting: Urology

## 2023-09-26 DIAGNOSIS — R972 Elevated prostate specific antigen [PSA]: Secondary | ICD-10-CM

## 2023-09-26 MED ORDER — LEVOFLOXACIN 750 MG PO TABS: 750.0000 mg | ORAL_TABLET | Freq: Every day | ORAL | 0 refills | Status: DC

## 2023-09-26 NOTE — Telephone Encounter (Signed)
Patient made aware of Iso Psa results and Dr. Dimas Millin recommendations. Patient would like to proceed with prostate biopsy. Biopsy instructions went over with patients wife via phone and sent via mail. Orders placed and appointments scheduled.

## 2023-09-26 NOTE — Telephone Encounter (Signed)
Please other telephone encounter

## 2023-09-26 NOTE — Telephone Encounter (Signed)
Return call to patient. Patient was made aware once Dr. Ronne Binning review his blood work, someone will give him a call with Dr. Ronne Binning response. Patient voiced understanding

## 2023-09-26 NOTE — Telephone Encounter (Signed)
-----   Message from Wilkie Aye sent at 09/24/2023  9:07 AM EST ----- Iso was high and patient need a prostate biopsy ----- Message ----- From: Talbert Cage Sent: 09/23/2023   9:01 AM EST To: Malen Gauze, MD

## 2023-09-26 NOTE — Telephone Encounter (Signed)
Wants blood work results °

## 2023-11-11 ENCOUNTER — Ambulatory Visit: Payer: Medicare Other | Admitting: Urology

## 2023-11-11 ENCOUNTER — Other Ambulatory Visit: Payer: Self-pay | Admitting: Urology

## 2023-11-11 ENCOUNTER — Encounter (HOSPITAL_COMMUNITY): Payer: Self-pay

## 2023-11-11 ENCOUNTER — Ambulatory Visit (HOSPITAL_COMMUNITY)
Admission: RE | Admit: 2023-11-11 | Discharge: 2023-11-11 | Disposition: A | Discharge status: Home / Self Care | Payer: Medicare Other | Source: Ambulatory Visit | Attending: Urology | Admitting: Urology

## 2023-11-11 ENCOUNTER — Encounter: Payer: Self-pay | Admitting: Urology

## 2023-11-11 DIAGNOSIS — R972 Elevated prostate specific antigen [PSA]: Secondary | ICD-10-CM

## 2023-11-11 DIAGNOSIS — C61 Malignant neoplasm of prostate: Secondary | ICD-10-CM | POA: Diagnosis not present

## 2023-11-11 MED ORDER — LIDOCAINE HCL (PF) 2 % IJ SOLN
10.0000 mL | Freq: Once | INTRAMUSCULAR | Status: AC
  Administered 2023-11-11: 10 mL

## 2023-11-11 MED ORDER — GENTAMICIN SULFATE 40 MG/ML IJ SOLN
80.0000 mg | Freq: Once | INTRAMUSCULAR | Status: AC
  Administered 2023-11-11: 80 mg via INTRAMUSCULAR

## 2023-11-11 MED ORDER — LIDOCAINE HCL (PF) 2 % IJ SOLN
INTRAMUSCULAR | Status: AC
  Filled 2023-11-11: qty 10

## 2023-11-11 MED ORDER — GENTAMICIN SULFATE 40 MG/ML IJ SOLN
INTRAMUSCULAR | Status: AC
  Filled 2023-11-11: qty 2

## 2023-11-11 NOTE — Progress Notes (Signed)
 PT tolerated prostate biopsy procedure and antibiotic injection well today. Labs obtained and sent for pathology by Lexi from ultrasound. PT ambulatory at discharge with family with no acute distress noted and verbalized understanding of discharge instructions. PT to follow up with urologist as scheduled on 11/20/23 @ 4:00 pm.

## 2023-11-11 NOTE — Progress Notes (Signed)
 Prostate Biopsy Procedure   Informed consent was obtained after discussing risks/benefits of the procedure.  A time out was performed to ensure correct patient identity.  Pre-Procedure: - Last PSA Level: No results found for: "PSA" - Gentamicin given prophylactically - Levaquin 500 mg administered PO -Transrectal Ultrasound performed revealing a 36.7 gm prostate -No significant hypoechoic or median lobe noted  Procedure: - Prostate block performed using 10 cc 1% lidocaine and biopsies taken from sextant areas, a total of 12 under ultrasound guidance.  Post-Procedure: - Patient tolerated the procedure well - He was counseled to seek immediate medical attention if experiences any severe pain, significant bleeding, or fevers - Return in one week to discuss biopsy results

## 2023-11-12 LAB — SURGICAL PATHOLOGY

## 2023-11-14 ENCOUNTER — Encounter (INDEPENDENT_AMBULATORY_CARE_PROVIDER_SITE_OTHER): Payer: Medicare Other | Admitting: Ophthalmology

## 2023-11-14 DIAGNOSIS — H353231 Exudative age-related macular degeneration, bilateral, with active choroidal neovascularization: Secondary | ICD-10-CM | POA: Diagnosis not present

## 2023-11-14 DIAGNOSIS — H2513 Age-related nuclear cataract, bilateral: Secondary | ICD-10-CM

## 2023-11-14 DIAGNOSIS — I10 Essential (primary) hypertension: Secondary | ICD-10-CM

## 2023-11-14 DIAGNOSIS — H43813 Vitreous degeneration, bilateral: Secondary | ICD-10-CM

## 2023-11-14 DIAGNOSIS — H35033 Hypertensive retinopathy, bilateral: Secondary | ICD-10-CM

## 2023-11-20 ENCOUNTER — Ambulatory Visit: Payer: Medicare Other | Admitting: Urology

## 2023-11-20 VITALS — BP 154/80 | HR 71

## 2023-11-20 DIAGNOSIS — C61 Malignant neoplasm of prostate: Secondary | ICD-10-CM

## 2023-11-20 NOTE — Progress Notes (Signed)
 11/20/2023 4:34 PM   Ryan [name] 1941-08-10 [id number]  Referring provider: Benita Stabile, MD [address] [name],  [place] [address]  Followup prostate biopsy   HPI: Ryan [name] is a 82yo here for followup after prostate biopsy. Biopsy revealed Gleason 3+4=7 in 1/12 cores and Gleason 3+3=6 in 1/12 cores. PSA 6.3.    PMH: No past medical history on file.  Surgical History: Past Surgical History:  Procedure Laterality Date   HERNIA REPAIR     Right side   INGUINAL HERNIA REPAIR Left 07/16/2016   Procedure: HERNIA REPAIR INGUINAL ADULT WITH MESH;  Surgeon: Franky Macho, MD;  Location: [facility];  Service: General;  Laterality: Left;    Home Medications:  Allergies as of 11/20/2023       Reactions   Shellfish Allergy Swelling        Medication List        Accurate as of November 20, 2023  4:34 PM. If you have any questions, ask your nurse or doctor.          STOP taking these medications    HYDROcodone-acetaminophen 5-325 MG tablet Commonly known as: Norco       TAKE these medications    ascorbic acid 500 MG tablet Commonly known as: VITAMIN C Take 500 mg by mouth daily.   levofloxacin 750 MG tablet Commonly known as: Levaquin Take 1 tablet (750 mg total) by mouth daily. Take 1 hour prior to your prostate biopsy procedure.   losartan 50 MG tablet Commonly known as: COZAAR Take 50 mg by mouth daily.   PRESERVISION AREDS PO Take 2 capsules by mouth daily.   pseudoephedrine-acetaminophen 30-500 MG Tabs tablet Commonly known as: TYLENOL SINUS Take 1 tablet by mouth every 6 (six) hours as needed.        Allergies:  Allergies  Allergen Reactions   Shellfish Allergy Swelling    Family History: No family history on file.  Social History:  reports that he has been smoking cigarettes. He has a 25 pack-year smoking history. He has never used smokeless tobacco. He reports that he does not drink alcohol and does not use drugs.  ROS: All  other review of systems were reviewed and are negative except what is noted above in HPI  Physical Exam: BP (!) 154/80   Pulse 71   Constitutional:  Alert and oriented, No acute distress. HEENT: [place] AT, moist mucus membranes.  Trachea midline, no masses. Cardiovascular: No clubbing, cyanosis, or edema. Respiratory: Normal respiratory effort, no increased work of breathing. GI: Abdomen is soft, nontender, nondistended, no abdominal masses GU: No CVA tenderness.  Lymph: No cervical or inguinal lymphadenopathy. Skin: No rashes, bruises or suspicious lesions. Neurologic: Grossly intact, no focal deficits, moving all 4 extremities. Psychiatric: Normal mood and affect.  Laboratory Data: Lab Results  Component Value Date   WBC 6.2 07/12/2016   HGB 12.9 (L) 07/12/2016   HCT 39.2 07/12/2016   MCV 93.8 07/12/2016   PLT 232 07/12/2016    Lab Results  Component Value Date   CREATININE 0.84 07/12/2016    No results found for: "PSA"  No results found for: "TESTOSTERONE"  No results found for: "HGBA1C"  Urinalysis    Component Value Date/Time   APPEARANCEUR Cloudy (A) 09/18/2023 1050   GLUCOSEU Negative 09/18/2023 1050   BILIRUBINUR Negative 09/18/2023 1050   PROTEINUR Negative 09/18/2023 1050   NITRITE Negative 09/18/2023 1050   LEUKOCYTESUR Negative 09/18/2023 1050    Lab Results  Component Value Date   LABMICR Comment 09/18/2023    Pertinent Imaging:  No results found for this or any previous visit.  No results found for this or any previous visit.  No results found for this or any previous visit.  No results found for this or any previous visit.  No results found for this or any previous visit.  No results found for this or any previous visit.  No results found for this or any previous visit.  No results found for this or any previous visit.   Assessment & Plan:    1. Prostate cancer (HCC) (Primary) I discussed the natural history of intermediate risk  prostate cancer with the patient and the various treatment options including active surveillance, RALP, IMRT, brachytherapy, cryotherapy, HIFU and ADT. After discussing the options the patient elects for Surveillance versus radiation therapy. I will refer him to Dr. Kathrynn Running.  After we reviewed the treatment options for prostate cancer which include but are not limited to surgery, radiation, cryo, HIFU, hormones and minimally invasive options [laser, focal therapy etc], we spoke of active surveillance (AS) as a management strategy for prostate cancer. I pointed out the differences between AS and watchful waiting [facility]) whereby the former approach utilizes periodic reassessment with the possibility of later entering active treatment compared to the latter which does not assess for cancer progression but will initiate palliative therapy if the patient becomes symptomatic. We reviewed some of the symptoms of advanced disease including weight loss, bone pain, worsened LUTS etc. I told the patient that by electing AS or no treatment, and because this is prostate cancer, there is always the chance for cancer progression and loss of the window of curability. This is a risk that the patient must be willing to accept. Active surveillance has not been prospectively validated in clinical trials in intermediate-risk disease, but it may be considered in favorable intermediate-risk disease. However, for men with intermediate risk disease, active surveillance comes with a higher risk of developing metastases compared to definitive treatment. Other drawbacks of AS include the need for continual monitoring with PSA, clinic visits, occasional prostate biopsies and their attendant risks/costs and possibly imaging studies. For some patients on a surveillance strategy, there may be a psychological burden to obviating active treatment of prostate cancer.  We outlined various methods to monitor patients on AS, which at a minimum includes  every 6-12 month PSAs, DREs and visits. Initially I recommend a follow up biopsy within 1 year, but as the years progress and should the patient not be found to have more aggressive disease, the interval between biopsies may be extended. The three more common reasons to enter into an active treatment protocol are (1) a greater volume of cancer detected, (2) higher grade cancer, (3) patient concern or preference. Finally I told the patient that even though we might think the patient is a candidate for AS, understaging and undergrading does occur, and knowledge of whether the patient is actually a good candidate is incomplete. I could not make any guarantee that the patient was making the right decision or that his cancer may later be cured if the patient eventually opted for treatment. The patient expressed understanding of these issues.     No follow-ups on file.  Wilkie Aye, MD  [facility] [place]

## 2023-11-21 ENCOUNTER — Telehealth: Payer: Self-pay | Admitting: Radiation Oncology

## 2023-11-21 NOTE — Telephone Encounter (Signed)
Left message for patient to call back to schedule consult per 3/19 referral.

## 2023-11-22 NOTE — Progress Notes (Signed)
 GU Location of Tumor / Histology: Prostate Ca  If Prostate Cancer, Gleason Score is (3 + 4) and PSA is (11.9 on 09/23/2023)  Daun Peacock presented as referral from Dr. Wilkie Aye [facility] [place]) elevated PSA.  Biopsies      Past/Anticipated interventions by urology, if any:     Past/Anticipated interventions by medical oncology, if any:  NA  Weight changes, if any: No  IPSS:   9 SHIM:  22  Bowel/Bladder complaints, if any:  No  Nausea/Vomiting, if any: No  Pain issues, if any:  0/10  SAFETY ISSUES: Prior radiation?  No Pacemaker/ICD? No Possible current pregnancy? Male Is the patient on methotrexate? No  Current Complaints / other details:

## 2023-11-26 ENCOUNTER — Encounter: Payer: Self-pay | Admitting: Urology

## 2023-11-26 NOTE — Patient Instructions (Signed)

## 2023-12-03 ENCOUNTER — Ambulatory Visit
Admission: RE | Admit: 2023-12-03 | Discharge: 2023-12-03 | Disposition: A | Discharge status: Home / Self Care | Source: Ambulatory Visit | Attending: Radiation Oncology | Admitting: Radiation Oncology

## 2023-12-03 ENCOUNTER — Encounter: Payer: Self-pay | Admitting: Urology

## 2023-12-03 ENCOUNTER — Encounter: Payer: Self-pay | Admitting: Radiation Oncology

## 2023-12-03 VITALS — BP 163/96 | HR 72 | Temp 98.2°F | Resp 18 | Wt 145.8 lb

## 2023-12-03 DIAGNOSIS — C61 Malignant neoplasm of prostate: Secondary | ICD-10-CM

## 2023-12-03 DIAGNOSIS — Z191 Hormone sensitive malignancy status: Secondary | ICD-10-CM | POA: Diagnosis not present

## 2023-12-03 DIAGNOSIS — F17210 Nicotine dependence, cigarettes, uncomplicated: Secondary | ICD-10-CM | POA: Diagnosis not present

## 2023-12-03 DIAGNOSIS — Z79899 Other long term (current) drug therapy: Secondary | ICD-10-CM | POA: Diagnosis not present

## 2023-12-03 NOTE — Progress Notes (Signed)
 Radiation Oncology         [phone] ________________________________  Initial Outpatient Consultation  Name: Ryan [name] MRN: [id number]  Date: 12/03/2023  DOB: 08-14-1941  WU:JWJX, Kathleene Hazel, MD  McKenzie, Mardene Celeste, MD   REFERRING PHYSICIAN: Malen Gauze, MD  DIAGNOSIS: 83 y.o. gentleman with Stage T1c adenocarcinoma of the prostate with Gleason score of 3+4, and PSA of 6.    ICD-10-CM   1. Malignant neoplasm of prostate (HCC)  C61       HISTORY OF PRESENT ILLNESS: Ryan [name] is a 83 y.o. male with a diagnosis of prostate cancer. He was noted to have an elevated PSA of 6.3 by his primary care physician, Dr. Catalina Pizza.  Accordingly, he was referred for evaluation in urology by Dr. Ronne Binning on 09/18/23. An ISO PSA test was performed that day and showed persistent PSA elevation at 5.99 and an elevated index score of 11.9, indicating a high risk for high-grade cancer on biopsy. Therefore, the patient proceeded to transrectal ultrasound with 12 biopsies of the prostate on 11/11/23.  The prostate volume measured 36.7 cc.  Out of 12 core biopsies, 2 were positive.  The maximum Gleason score was 3+4, and this was seen in the left apex (two foci). Additionally, Gleason 3+3 was seen in the left apex lateral.  The patient reviewed the biopsy results with his urologist and he has kindly been referred today for discussion of potential radiation treatment options.   PREVIOUS RADIATION THERAPY: No  PAST MEDICAL HISTORY: No past medical history on file.    PAST SURGICAL HISTORY: Past Surgical History:  Procedure Laterality Date   HERNIA REPAIR     Right side   INGUINAL HERNIA REPAIR Left 07/16/2016   Procedure: HERNIA REPAIR INGUINAL ADULT WITH MESH;  Surgeon: Franky Macho, MD;  Location: [facility];  Service: General;  Laterality: Left;    FAMILY HISTORY: No family history on file.  SOCIAL HISTORY:  Social History   Socioeconomic History   Marital status: Married    Spouse  name: Not on file   Number of children: Not on file   Years of education: Not on file   Highest education level: Not on file  Occupational History   Not on file  Tobacco Use   Smoking status: Every Day    Current packs/day: 0.50    Average packs/day: 0.5 packs/day for 50.0 years (25.0 ttl pk-yrs)    Types: Cigarettes   Smokeless tobacco: Never  Vaping Use   Vaping status: Never Used  Substance and Sexual Activity   Alcohol use: No   Drug use: No   Sexual activity: Not on file  Other Topics Concern   Not on file  Social History Narrative   Not on file   Social Drivers of Health   Financial Resource Strain: Not on file  Food Insecurity: No Food Insecurity (12/03/2023)   Hunger Vital Sign    Worried About Running Out of Food in the Last Year: Never true    Ran Out of Food in the Last Year: Never true  Transportation Needs: No Transportation Needs (12/03/2023)   PRAPARE - Administrator, Civil Service (Medical): No    Lack of Transportation (Non-Medical): No  Physical Activity: Not on file  Stress: Not on file  Social Connections: Not on file  Intimate Partner Violence: Not At Risk (12/03/2023)   Humiliation, Afraid, Rape, and Kick questionnaire    Fear of Current or Ex-Partner: No  Emotionally Abused: No    Physically Abused: No    Sexually Abused: No    ALLERGIES: Shellfish allergy  MEDICATIONS:  Current Outpatient Medications  Medication Sig Dispense Refill   ciprofloxacin (CILOXAN) 0.3 % ophthalmic solution Place 1 drop into both eyes 4 (four) times daily. After patient takes shot will use medication.     losartan (COZAAR) 50 MG tablet Take 50 mg by mouth daily.     Multiple Vitamins-Minerals (PRESERVISION AREDS PO) Take 2 capsules by mouth daily.     pseudoephedrine-acetaminophen (TYLENOL SINUS) 30-500 MG TABS tablet Take 1 tablet by mouth every 6 (six) hours as needed.     vitamin C (ASCORBIC ACID) 500 MG tablet Take 500 mg by mouth daily.     No  current facility-administered medications for this encounter.    REVIEW OF SYSTEMS:  On review of systems, the patient reports that he is doing well overall. He denies any chest pain, shortness of breath, cough, fevers, chills, night sweats, unintended weight changes. He denies any bowel disturbances, and denies abdominal pain, nausea or vomiting. He denies any new musculoskeletal or joint aches or pains. His IPSS was 9, indicating mild-moderate urinary symptoms. His SHIM was 22, indicating he does not have erectile dysfunction. A complete review of systems is obtained and is otherwise negative.  PHYSICAL EXAM:  Wt Readings from Last 3 Encounters:  12/03/23 145 lb 12.8 oz (66.1 kg)  07/16/16 160 lb (72.6 kg)   Temp Readings from Last 3 Encounters:  12/03/23 98.2 F (36.8 C) (Oral)  11/11/23 98 F (36.7 C) (Oral)  07/16/16 97.5 F (36.4 C) (Oral)   BP Readings from Last 3 Encounters:  12/03/23 (!) 163/96  11/20/23 (!) 154/80  11/11/23 (!) 176/90   Pulse Readings from Last 3 Encounters:  12/03/23 72  11/20/23 71  11/11/23 60    /10  In general this is a well appearing Caucasian male in no acute distress. He's alert and oriented x4 and appropriate throughout the examination. Cardiopulmonary assessment is negative for acute distress, and he exhibits normal effort.     KPS = 90  100 - Normal; no complaints; no evidence of disease. 90   - Able to carry on normal activity; minor signs or symptoms of disease. 80   - Normal activity with effort; some signs or symptoms of disease. 67   - Cares for self; unable to carry on normal activity or to do active work. 60   - Requires occasional assistance, but is able to care for most of his personal needs. 50   - Requires considerable assistance and frequent medical care. 40   - Disabled; requires special care and assistance. 30   - Severely disabled; hospital admission is indicated although death not imminent. 20   - Very sick; hospital  admission necessary; active supportive treatment necessary. 10   - Moribund; fatal processes progressing rapidly. 0     - Dead  Karnofsky DA, Abelmann WH, Craver LS and Burchenal [facility] [phone]) The use of the nitrogen mustards in the palliative treatment of carcinoma: with particular reference to bronchogenic carcinoma Cancer 1 634-56  LABORATORY DATA:  Lab Results  Component Value Date   WBC 6.2 07/12/2016   HGB 12.9 (L) 07/12/2016   HCT 39.2 07/12/2016   MCV 93.8 07/12/2016   PLT 232 07/12/2016   Lab Results  Component Value Date   NA 138 07/12/2016   K 3.9 07/12/2016   CL 105 07/12/2016   CO2 29 07/12/2016  No results found for: "ALT", "AST", "GGT", "ALKPHOS", "BILITOT"   RADIOGRAPHY: US Guided Needle Placement Result Date: 11/12/2023 CLINICAL DATA:  Prostate biopsy. EXAM: IR ULTRASOUND GUIDED ASPIRATION/DRAINAGE TECHNIQUE: Ultrasound guidance was provided for prostate biopsy. COMPARISON:  None Available. FINDINGS: See above. IMPRESSION: Ultrasound guidance provided for prostate biopsy. Electronically Signed   By: Irish Lack M.D.   On: 11/12/2023 08:07   [place] Transrectal Complete Result Date: 11/11/2023 Please see Notes tab for imaging impression.  [place] PROSTATE BIOPSY MULTIPLE Result Date: 11/11/2023 Please see Notes tab for imaging impression.     IMPRESSION/PLAN: 1. 83 y.o. gentleman with Stage T1c adenocarcinoma of the prostate with Gleason Score of 3+4, and PSA of 6. We discussed the patient's workup and outlined the nature of prostate cancer in this setting. The patient's T stage, Gleason's score, and PSA put him into the favorable intermediate risk group. Accordingly, he is eligible for a variety of potential treatment options including active surveillance, brachytherapy, 5.5 weeks of external radiation, or prostatectomy. We discussed the available radiation techniques, and focused on the details and logistics of delivery. We discussed and outlined the risks, benefits,  short and long-term effects associated with radiotherapy and compared and contrasted these with prostatectomy. We discussed the role of SpaceOAR gel in reducing the rectal toxicity associated with radiotherapy. He appears to have a good understanding of his disease and our treatment recommendations which are of curative intent.  He was encouraged to ask questions that were answered to his stated satisfaction.  At the conclusion of our conversation, the patient is interested in moving forward with active surveillance which we feel is completely reasonable. We will share our discussion with Dr. Ronne Binning so that he can arrange for appropriate follow up with close monitoring of the PSA, periodic prostate MRI and annual prostate biopsy. We enjoyed meeting him and his wife and daughter today and look forward to following his progress via correspondence. They know that they are welcome to call at any time with any questions or concerns related to his prostate cancer and/or treatment options.  2. Tobacco abuse. The patient is a current smoker so we discussed smoking cessation which he is not currently interested in. We also discussed lung cancer screening with annual low dose CT Chest scans and he is in favor of enrolling in a lung cancer screening program. We will share this discussion with Dr. Margo Aye so that he can get the patient referred to the lung cancer screening program at [facility].  We personally spent 70 minutes in this encounter including chart review, reviewing radiological studies, meeting face-to-face with the patient, entering orders and completing documentation.    Marguarite Arbour, PA-C    Margaretmary Dys, MD  [facility]  Radiation Oncology Direct Dial: [phone]  Fax: [phone] [email or web address]  Skype  [email or web address]   This document serves as a record of services personally performed by Margaretmary Dys, MD and Marcello Fennel, PA-C. It was created on their behalf by Mickie Bail, a trained medical scribe. The creation of this record is based on the scribe's personal observations and the provider's statements to them. This document has been checked and approved by the attending provider.

## 2023-12-11 ENCOUNTER — Other Ambulatory Visit: Payer: Medicare Other

## 2023-12-13 ENCOUNTER — Ambulatory Visit: Payer: Medicare Other | Admitting: Urology

## 2023-12-18 ENCOUNTER — Ambulatory Visit: Payer: Medicare Other | Admitting: Urology

## 2024-01-09 ENCOUNTER — Encounter (INDEPENDENT_AMBULATORY_CARE_PROVIDER_SITE_OTHER): Admitting: Ophthalmology

## 2024-01-09 DIAGNOSIS — H2513 Age-related nuclear cataract, bilateral: Secondary | ICD-10-CM | POA: Diagnosis not present

## 2024-01-09 DIAGNOSIS — I10 Essential (primary) hypertension: Secondary | ICD-10-CM

## 2024-01-09 DIAGNOSIS — H353231 Exudative age-related macular degeneration, bilateral, with active choroidal neovascularization: Secondary | ICD-10-CM

## 2024-01-09 DIAGNOSIS — H35033 Hypertensive retinopathy, bilateral: Secondary | ICD-10-CM

## 2024-01-09 DIAGNOSIS — H43813 Vitreous degeneration, bilateral: Secondary | ICD-10-CM

## 2024-02-04 DIAGNOSIS — R809 Proteinuria, unspecified: Secondary | ICD-10-CM | POA: Diagnosis not present

## 2024-02-04 DIAGNOSIS — I10 Essential (primary) hypertension: Secondary | ICD-10-CM | POA: Diagnosis not present

## 2024-02-04 DIAGNOSIS — R972 Elevated prostate specific antigen [PSA]: Secondary | ICD-10-CM | POA: Diagnosis not present

## 2024-02-04 DIAGNOSIS — D649 Anemia, unspecified: Secondary | ICD-10-CM | POA: Diagnosis not present

## 2024-02-10 DIAGNOSIS — D649 Anemia, unspecified: Secondary | ICD-10-CM | POA: Diagnosis not present

## 2024-02-10 DIAGNOSIS — R011 Cardiac murmur, unspecified: Secondary | ICD-10-CM | POA: Diagnosis not present

## 2024-02-10 DIAGNOSIS — R195 Other fecal abnormalities: Secondary | ICD-10-CM | POA: Diagnosis not present

## 2024-02-10 DIAGNOSIS — F17200 Nicotine dependence, unspecified, uncomplicated: Secondary | ICD-10-CM | POA: Diagnosis not present

## 2024-02-10 DIAGNOSIS — C61 Malignant neoplasm of prostate: Secondary | ICD-10-CM | POA: Diagnosis not present

## 2024-02-10 DIAGNOSIS — R809 Proteinuria, unspecified: Secondary | ICD-10-CM | POA: Diagnosis not present

## 2024-02-10 DIAGNOSIS — H3530 Unspecified macular degeneration: Secondary | ICD-10-CM | POA: Diagnosis not present

## 2024-02-10 DIAGNOSIS — I10 Essential (primary) hypertension: Secondary | ICD-10-CM | POA: Diagnosis not present

## 2024-02-20 ENCOUNTER — Other Ambulatory Visit

## 2024-02-20 DIAGNOSIS — C61 Malignant neoplasm of prostate: Secondary | ICD-10-CM | POA: Diagnosis not present

## 2024-02-21 LAB — PSA: Prostate Specific Ag, Serum: 6.2 ng/mL — ABNORMAL HIGH (ref 0.0–4.0)

## 2024-02-27 ENCOUNTER — Encounter (INDEPENDENT_AMBULATORY_CARE_PROVIDER_SITE_OTHER): Admitting: Ophthalmology

## 2024-02-27 DIAGNOSIS — H43813 Vitreous degeneration, bilateral: Secondary | ICD-10-CM

## 2024-02-27 DIAGNOSIS — H35033 Hypertensive retinopathy, bilateral: Secondary | ICD-10-CM | POA: Diagnosis not present

## 2024-02-27 DIAGNOSIS — H353231 Exudative age-related macular degeneration, bilateral, with active choroidal neovascularization: Secondary | ICD-10-CM | POA: Diagnosis not present

## 2024-02-27 DIAGNOSIS — I10 Essential (primary) hypertension: Secondary | ICD-10-CM | POA: Diagnosis not present

## 2024-02-28 ENCOUNTER — Encounter: Payer: Self-pay | Admitting: Urology

## 2024-02-28 ENCOUNTER — Ambulatory Visit (INDEPENDENT_AMBULATORY_CARE_PROVIDER_SITE_OTHER): Admitting: Urology

## 2024-02-28 VITALS — BP 164/96 | HR 69

## 2024-02-28 DIAGNOSIS — C61 Malignant neoplasm of prostate: Secondary | ICD-10-CM

## 2024-02-28 NOTE — Progress Notes (Signed)
 02/28/2024 12:46 PM   Ryan [name] 1940-11-17 [id number]  Referring provider: Shona Ryan PEDLAR, MD [address] [name],  [place] [address]  Followup prostate cancer   HPI: [name] is an 83yo here for followup for prostate cancer. PSA stable at 6.2. He denies any worsening LUTS. IPSS 13 QOL 2 on no BPh therapy.    PMH: No past medical history on file.  Surgical History: Past Surgical History:  Procedure Laterality Date   HERNIA REPAIR     Right side   INGUINAL HERNIA REPAIR Left 07/16/2016   Procedure: HERNIA REPAIR INGUINAL ADULT WITH MESH;  Surgeon: [name], MD;  Location: [facility];  Service: General;  Laterality: Left;    Home Medications:  Allergies as of 02/28/2024       Reactions   Shellfish Allergy Swelling        Medication List        Accurate as of February 28, 2024 12:46 PM. If you have any questions, ask your nurse or doctor.          ascorbic acid 500 MG tablet Commonly known as: VITAMIN C Take 500 mg by mouth daily.   ciprofloxacin 0.3 % ophthalmic solution Commonly known as: CILOXAN Place 1 drop into both eyes 4 (four) times daily. After patient takes shot will use medication.   losartan 50 MG tablet Commonly known as: COZAAR Take 50 mg by mouth daily.   PRESERVISION AREDS PO Take 2 capsules by mouth daily.   pseudoephedrine-acetaminophen  30-500 MG Tabs tablet Commonly known as: TYLENOL  SINUS Take 1 tablet by mouth every 6 (six) hours as needed.        Allergies:  Allergies  Allergen Reactions   Shellfish Allergy Swelling    Family History: No family history on file.  Social History:  reports that he has been smoking cigarettes. He has a 25 pack-year smoking history. He has never used smokeless tobacco. He reports that he does not drink alcohol and does not use drugs.  ROS: All other review of systems were reviewed and are negative except what is noted above in HPI  Physical Exam: BP (!) 164/96   Pulse 69    Constitutional:  Alert and oriented, No acute distress. HEENT: [place] AT, moist mucus membranes.  Trachea midline, no masses. Cardiovascular: No clubbing, cyanosis, or edema. Respiratory: Normal respiratory effort, no increased work of breathing. GI: Abdomen is soft, nontender, nondistended, no abdominal masses GU: No CVA tenderness.  Lymph: No cervical or inguinal lymphadenopathy. Skin: No rashes, bruises or suspicious lesions. Neurologic: Grossly intact, no focal deficits, moving all 4 extremities. Psychiatric: Normal mood and affect.  Laboratory Data: Lab Results  Component Value Date   WBC 6.2 07/12/2016   HGB 12.9 (L) 07/12/2016   HCT 39.2 07/12/2016   MCV 93.8 07/12/2016   PLT 232 07/12/2016    Lab Results  Component Value Date   CREATININE 0.84 07/12/2016    No results found for: PSA  No results found for: TESTOSTERONE  No results found for: HGBA1C  Urinalysis    Component Value Date/Time   APPEARANCEUR Cloudy (A) 09/18/2023 1050   GLUCOSEU Negative 09/18/2023 1050   BILIRUBINUR Negative 09/18/2023 1050   PROTEINUR Negative 09/18/2023 1050   NITRITE Negative 09/18/2023 1050   LEUKOCYTESUR Negative 09/18/2023 1050    Lab Results  Component Value Date   LABMICR Comment 09/18/2023    Pertinent Imaging:  No results found for this or any previous visit.  No  results found for this or any previous visit.  No results found for this or any previous visit.  No results found for this or any previous visit.  No results found for this or any previous visit.  No results found for this or any previous visit.  No results found for this or any previous visit.  No results found for this or any previous visit.   Assessment & Plan:    1. Prostate cancer (HCC) (Primary) Followup 6 months with PSA   No follow-ups on file.  [name], MD  [facility] [place]

## 2024-02-28 NOTE — Patient Instructions (Signed)

## 2024-03-10 DIAGNOSIS — C61 Malignant neoplasm of prostate: Secondary | ICD-10-CM | POA: Diagnosis not present

## 2024-03-10 DIAGNOSIS — I10 Essential (primary) hypertension: Secondary | ICD-10-CM | POA: Diagnosis not present

## 2024-04-07 DIAGNOSIS — Z713 Dietary counseling and surveillance: Secondary | ICD-10-CM | POA: Diagnosis not present

## 2024-04-07 DIAGNOSIS — Z6823 Body mass index (BMI) 23.0-23.9, adult: Secondary | ICD-10-CM | POA: Diagnosis not present

## 2024-04-07 DIAGNOSIS — I10 Essential (primary) hypertension: Secondary | ICD-10-CM | POA: Diagnosis not present

## 2024-04-23 ENCOUNTER — Encounter (INDEPENDENT_AMBULATORY_CARE_PROVIDER_SITE_OTHER): Admitting: Ophthalmology

## 2024-04-23 DIAGNOSIS — H353231 Exudative age-related macular degeneration, bilateral, with active choroidal neovascularization: Secondary | ICD-10-CM | POA: Diagnosis not present

## 2024-04-23 DIAGNOSIS — H43813 Vitreous degeneration, bilateral: Secondary | ICD-10-CM | POA: Diagnosis not present

## 2024-04-23 DIAGNOSIS — H35033 Hypertensive retinopathy, bilateral: Secondary | ICD-10-CM | POA: Diagnosis not present

## 2024-04-23 DIAGNOSIS — I10 Essential (primary) hypertension: Secondary | ICD-10-CM | POA: Diagnosis not present

## 2024-06-04 DIAGNOSIS — D649 Anemia, unspecified: Secondary | ICD-10-CM | POA: Diagnosis not present

## 2024-06-04 DIAGNOSIS — I10 Essential (primary) hypertension: Secondary | ICD-10-CM | POA: Diagnosis not present

## 2024-06-04 DIAGNOSIS — R809 Proteinuria, unspecified: Secondary | ICD-10-CM | POA: Diagnosis not present

## 2024-06-08 DIAGNOSIS — C61 Malignant neoplasm of prostate: Secondary | ICD-10-CM | POA: Diagnosis not present

## 2024-06-08 DIAGNOSIS — R809 Proteinuria, unspecified: Secondary | ICD-10-CM | POA: Diagnosis not present

## 2024-06-08 DIAGNOSIS — F17210 Nicotine dependence, cigarettes, uncomplicated: Secondary | ICD-10-CM | POA: Diagnosis not present

## 2024-06-08 DIAGNOSIS — H3530 Unspecified macular degeneration: Secondary | ICD-10-CM | POA: Diagnosis not present

## 2024-06-08 DIAGNOSIS — Z79899 Other long term (current) drug therapy: Secondary | ICD-10-CM | POA: Diagnosis not present

## 2024-06-08 DIAGNOSIS — R195 Other fecal abnormalities: Secondary | ICD-10-CM | POA: Diagnosis not present

## 2024-06-08 DIAGNOSIS — I10 Essential (primary) hypertension: Secondary | ICD-10-CM | POA: Diagnosis not present

## 2024-06-08 DIAGNOSIS — Z23 Encounter for immunization: Secondary | ICD-10-CM | POA: Diagnosis not present

## 2024-06-08 DIAGNOSIS — E785 Hyperlipidemia, unspecified: Secondary | ICD-10-CM | POA: Diagnosis not present

## 2024-06-08 DIAGNOSIS — R011 Cardiac murmur, unspecified: Secondary | ICD-10-CM | POA: Diagnosis not present

## 2024-06-08 DIAGNOSIS — D649 Anemia, unspecified: Secondary | ICD-10-CM | POA: Diagnosis not present

## 2024-06-08 DIAGNOSIS — Z0000 Encounter for general adult medical examination without abnormal findings: Secondary | ICD-10-CM | POA: Diagnosis not present

## 2024-06-11 ENCOUNTER — Encounter (INDEPENDENT_AMBULATORY_CARE_PROVIDER_SITE_OTHER): Admitting: Ophthalmology

## 2024-06-11 DIAGNOSIS — H35033 Hypertensive retinopathy, bilateral: Secondary | ICD-10-CM

## 2024-06-11 DIAGNOSIS — I10 Essential (primary) hypertension: Secondary | ICD-10-CM | POA: Diagnosis not present

## 2024-06-11 DIAGNOSIS — H353231 Exudative age-related macular degeneration, bilateral, with active choroidal neovascularization: Secondary | ICD-10-CM

## 2024-06-11 DIAGNOSIS — H43813 Vitreous degeneration, bilateral: Secondary | ICD-10-CM | POA: Diagnosis not present

## 2024-08-06 ENCOUNTER — Encounter (INDEPENDENT_AMBULATORY_CARE_PROVIDER_SITE_OTHER): Admitting: Ophthalmology

## 2024-08-06 DIAGNOSIS — I10 Essential (primary) hypertension: Secondary | ICD-10-CM | POA: Diagnosis not present

## 2024-08-06 DIAGNOSIS — H353231 Exudative age-related macular degeneration, bilateral, with active choroidal neovascularization: Secondary | ICD-10-CM | POA: Diagnosis not present

## 2024-08-06 DIAGNOSIS — H43813 Vitreous degeneration, bilateral: Secondary | ICD-10-CM

## 2024-08-06 DIAGNOSIS — H35033 Hypertensive retinopathy, bilateral: Secondary | ICD-10-CM

## 2024-09-01 ENCOUNTER — Other Ambulatory Visit

## 2024-09-01 DIAGNOSIS — C61 Malignant neoplasm of prostate: Secondary | ICD-10-CM

## 2024-09-02 LAB — PSA: Prostate Specific Ag, Serum: 8.6 ng/mL — ABNORMAL HIGH (ref 0.0–4.0)

## 2024-09-09 ENCOUNTER — Ambulatory Visit: Admitting: Urology

## 2024-09-09 ENCOUNTER — Encounter: Payer: Self-pay | Admitting: Urology

## 2024-09-09 VITALS — BP 135/69 | HR 87

## 2024-09-09 DIAGNOSIS — C61 Malignant neoplasm of prostate: Secondary | ICD-10-CM | POA: Diagnosis not present

## 2024-09-09 LAB — URINALYSIS, ROUTINE W REFLEX MICROSCOPIC
Bilirubin, UA: NEGATIVE
Glucose, UA: NEGATIVE
Nitrite, UA: NEGATIVE
Protein,UA: NEGATIVE
RBC, UA: NEGATIVE
Specific Gravity, UA: 1.01 (ref 1.005–1.030)
Urobilinogen, Ur: 0.2 mg/dL (ref 0.2–1.0)
pH, UA: 7.5 (ref 5.0–7.5)

## 2024-09-09 LAB — MICROSCOPIC EXAMINATION

## 2024-09-09 NOTE — Progress Notes (Signed)
 "  09/09/2024 3:01 PM   Ryan [name] [birth year] [id number]  Referring provider: Shona Ryan PEDLAR, MD [address] [name],  [place] [address]  Followup prostate cancer   HPI: Ryan [name] is a 83yo here for followup for prostate cancer. PSA increased from 6.2 to 8.6. He remains on active surveillance for intermediate risk prostate cancer. IPSS 12 QOL 1. Nocturia 2x. Urine stream strong   PMH: No past medical history on file.  Surgical History: Past Surgical History:  Procedure Laterality Date   HERNIA REPAIR     Right side   INGUINAL HERNIA REPAIR Left 07/16/2016   Procedure: HERNIA REPAIR INGUINAL ADULT WITH MESH;  Surgeon: Ryan Budge, MD;  Location: [facility];  Service: General;  Laterality: Left;    Home Medications:  Allergies as of 09/09/2024       Reactions   Shellfish Allergy Swelling        Medication List        Accurate as of September 09, 2024  3:01 PM. If you have any questions, ask your nurse or doctor.          ascorbic acid 500 MG tablet Commonly known as: VITAMIN C Take 500 mg by mouth daily.   ciprofloxacin 0.3 % ophthalmic solution Commonly known as: CILOXAN Place 1 drop into both eyes 4 (four) times daily. After patient takes shot will use medication.   losartan 50 MG tablet Commonly known as: COZAAR Take 50 mg by mouth daily.   PRESERVISION AREDS PO Take 2 capsules by mouth daily.   pseudoephedrine-acetaminophen  30-500 MG Tabs tablet Commonly known as: TYLENOL  SINUS Take 1 tablet by mouth every 6 (six) hours as needed.        Allergies: Allergies[1]  Family History: No family history on file.  Social History:  reports that he has been smoking cigarettes. He has a 25 pack-year smoking history. He has never used smokeless tobacco. He reports that he does not drink alcohol and does not use drugs.  ROS: All other review of systems were reviewed and are negative except what is noted above in HPI  Physical Exam: BP 135/69    Pulse 87   Constitutional:  Alert and oriented, No acute distress. HEENT: [place] AT, moist mucus membranes.  Trachea midline, no masses. Cardiovascular: No clubbing, cyanosis, or edema. Respiratory: Normal respiratory effort, no increased work of breathing. GI: Abdomen is soft, nontender, nondistended, no abdominal masses GU: No CVA tenderness.  Lymph: No cervical or inguinal lymphadenopathy. Skin: No rashes, bruises or suspicious lesions. Neurologic: Grossly intact, no focal deficits, moving all 4 extremities. Psychiatric: Normal mood and affect.  Laboratory Data: Lab Results  Component Value Date   WBC 6.2 07/12/2016   HGB 12.9 (L) 07/12/2016   HCT 39.2 07/12/2016   MCV 93.8 07/12/2016   PLT 232 07/12/2016    Lab Results  Component Value Date   CREATININE 0.84 07/12/2016    No results found for: PSA  No results found for: TESTOSTERONE  No results found for: HGBA1C  Urinalysis    Component Value Date/Time   APPEARANCEUR Cloudy (A) 09/18/2023 1050   GLUCOSEU Negative 09/18/2023 1050   BILIRUBINUR Negative 09/18/2023 1050   PROTEINUR Negative 09/18/2023 1050   NITRITE Negative 09/18/2023 1050   LEUKOCYTESUR Negative 09/18/2023 1050    Lab Results  Component Value Date   LABMICR Comment 09/18/2023    Pertinent Imaging:  No results found for this or any previous visit.  No results found for  this or any previous visit.  No results found for this or any previous visit.  No results found for this or any previous visit.  No results found for this or any previous visit.  No results found for this or any previous visit.  No results found for this or any previous visit.  No results found for this or any previous visit.   Assessment & Plan:    1. Prostate cancer (HCC) (Primary) MRI prostate, will call with results. If normal I will see him back in 6 months with a PSA. If abnormal we will proceed with prostate biopsy - Urinalysis, Routine w reflex  microscopic   No follow-ups on file.  Ryan Clara, MD  [facility] [place]      [1]  Allergies Allergen Reactions   Shellfish Allergy Swelling   "

## 2024-09-09 NOTE — Patient Instructions (Signed)

## 2024-09-15 ENCOUNTER — Ambulatory Visit (HOSPITAL_COMMUNITY)
Admission: RE | Admit: 2024-09-15 | Discharge: 2024-09-15 | Disposition: A | Discharge status: Home / Self Care | Source: Ambulatory Visit | Attending: Urology | Admitting: Urology

## 2024-09-15 DIAGNOSIS — C61 Malignant neoplasm of prostate: Secondary | ICD-10-CM | POA: Diagnosis present

## 2024-09-15 MED ORDER — GADOBUTROL 1 MMOL/ML IV SOLN
7.0000 mL | Freq: Once | INTRAVENOUS | Status: AC | PRN
  Administered 2024-09-15: 7 mL via INTRAVENOUS

## 2024-09-16 ENCOUNTER — Telehealth: Payer: Self-pay | Admitting: Urology

## 2024-09-16 DIAGNOSIS — C61 Malignant neoplasm of prostate: Secondary | ICD-10-CM

## 2024-09-16 NOTE — Telephone Encounter (Signed)
 Patient returned call requesting MRI results.   Test was completed on 09/15/2024.   Best number to contact patient [phone] Call transferred to clinical basket.

## 2024-09-17 NOTE — Telephone Encounter (Signed)
 Daughter came by office checking on results. Please call [phone]

## 2024-09-18 ENCOUNTER — Telehealth: Payer: Self-pay

## 2024-09-18 NOTE — Telephone Encounter (Signed)
 Spoke with patient daughter and let her know the MRI has not been finalized as of yet but once we get the final report from radiology someone will be in touch with the results patient daughter voiced her understanding

## 2024-09-18 NOTE — Telephone Encounter (Signed)
 Pt's daughter is made aware that Radiology has not read the report as of yet. Once MD review, someone will call back with result. Voiced understanding

## 2024-09-25 NOTE — Telephone Encounter (Signed)
 Return called to pt's daughter making her aware resulted routed to MD, McKenzie and once review someone will f/u with the next steps. Voiced understanding

## 2024-09-29 NOTE — Addendum Note (Signed)
 Addended by: SAMMIE EXIE HERO on: 09/29/2024 01:41 PM   Modules accepted: Orders

## 2024-09-29 NOTE — Telephone Encounter (Signed)
 Attempted to call patient to give MD recommendation lvm advising to return phone call

## 2024-10-08 ENCOUNTER — Encounter (INDEPENDENT_AMBULATORY_CARE_PROVIDER_SITE_OTHER): Admitting: Ophthalmology

## 2024-10-08 DIAGNOSIS — H353231 Exudative age-related macular degeneration, bilateral, with active choroidal neovascularization: Secondary | ICD-10-CM

## 2024-10-08 DIAGNOSIS — H43813 Vitreous degeneration, bilateral: Secondary | ICD-10-CM

## 2024-10-08 DIAGNOSIS — I10 Essential (primary) hypertension: Secondary | ICD-10-CM

## 2024-10-08 DIAGNOSIS — H35033 Hypertensive retinopathy, bilateral: Secondary | ICD-10-CM

## 2024-10-21 ENCOUNTER — Ambulatory Visit: Admitting: Urology

## 2024-12-10 ENCOUNTER — Encounter (INDEPENDENT_AMBULATORY_CARE_PROVIDER_SITE_OTHER): Admitting: Ophthalmology

## 2025-03-15 ENCOUNTER — Other Ambulatory Visit

## 2025-03-24 ENCOUNTER — Ambulatory Visit: Admitting: Urology
